# Patient Record
Sex: Female | Born: 1935 | Race: White | Hispanic: No | Smoking: Never smoker
Health system: Southern US, Community
[De-identification: ages and names within clinical notes are randomized; demographics above are authoritative.]

---

## 2015-09-21 ENCOUNTER — Ambulatory Visit (INDEPENDENT_AMBULATORY_CARE_PROVIDER_SITE_OTHER): Payer: PRIVATE HEALTH INSURANCE | Admitting: Family Medicine

## 2015-09-21 VITALS — BP 128/84 | HR 61 | Temp 98.7°F | Resp 16 | Ht 59.84 in | Wt 137.0 lb

## 2015-09-21 DIAGNOSIS — K2100 Gastro-esophageal reflux disease with esophagitis, without bleeding: Secondary | ICD-10-CM

## 2015-09-21 DIAGNOSIS — I83019 Varicose veins of right lower extremity with ulcer of unspecified site: Secondary | ICD-10-CM

## 2015-09-21 DIAGNOSIS — R251 Tremor, unspecified: Secondary | ICD-10-CM

## 2015-09-21 DIAGNOSIS — G5732 Lesion of lateral popliteal nerve, left lower limb: Secondary | ICD-10-CM | POA: Diagnosis not present

## 2015-09-21 DIAGNOSIS — L97919 Non-pressure chronic ulcer of unspecified part of right lower leg with unspecified severity: Secondary | ICD-10-CM

## 2015-09-21 NOTE — Progress Notes (Signed)
This is an 82 year old woman from [place]. She is brought in by her son and daughter-in-law.  Patient has several complaints: She has aching in the lateral calf of the left leg for over 6 months. She says the pain is also in the lateral aspect of her left thigh. She's had no injury.  Patient also has a head tremor and she wants to know if there is any make him be done about this. This is been going on for over 10 years.  Final patient complains about chronic cough and some bilious reflux. She was told and [place] that she had COPD and need to take an inhaler. The inhaler has not helped.  Patient also complains about noticeable large varicose veins in the right leg. She's been trying a knee length compressive stocking but that has not helped.  Objective: Diminutive woman in no acute distress  BP 128/84 mmHg  Pulse 61  Temp(Src) 98.7 F (37.1 C)  Resp 16  Ht 4' 11.84" (1.52 m)  Wt 137 lb (62.143 kg)  BMI 26.90 kg/m2  SpO2 98% Chest: Clear Heart: Regular no murmur Patient has a mild slow tremor of her head with her face moving back and forth. I've seen her in the past (15 years ago) sees and this is slightly worse than it was then.   Extremities show marked varicosities in the right lower extremity extending up from the ankle to the mid thigh. She's wearing an elastic stocking there is only minimal compression and only goes up to the knee.  There is some wasting of muscle mass in the peroneal aspect of her left calf. She has no tenderness and no loss of sensation. She has no edema. She has good range of motion of the knee and ankle.  Examination of the legs shows no edema. She does have marked varicose veins in the right leg.  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 2 patient has some reflux that is causing her respiratory symptoms 3 patient has varicose veins in the right leg 4 patient has a mild action tremor of her head  Plan:  Ranitidine 75 mg at night Compressive stockings to thigh with the right leg varicosities Stop the inhaler Reassurance regarding the atrophy in the leg and tremor  Signed, Elvina SidleKurt Dorena Dorfman

## 2021-09-10 ENCOUNTER — Ambulatory Visit: Payer: Self-pay | Admitting: Nurse Practitioner

## 2021-10-08 ENCOUNTER — Ambulatory Visit: Payer: Self-pay | Admitting: Internal Medicine

## 2021-10-29 ENCOUNTER — Other Ambulatory Visit (INDEPENDENT_AMBULATORY_CARE_PROVIDER_SITE_OTHER): Payer: Medicaid Other

## 2021-10-29 ENCOUNTER — Encounter: Payer: Self-pay | Admitting: Internal Medicine

## 2021-10-29 ENCOUNTER — Ambulatory Visit (INDEPENDENT_AMBULATORY_CARE_PROVIDER_SITE_OTHER): Payer: Medicaid Other | Admitting: Internal Medicine

## 2021-10-29 VITALS — BP 106/78 | HR 64 | Temp 98.5°F | Ht 59.0 in | Wt 126.0 lb

## 2021-10-29 DIAGNOSIS — S0990XA Unspecified injury of head, initial encounter: Secondary | ICD-10-CM | POA: Diagnosis not present

## 2021-10-29 DIAGNOSIS — Z87820 Personal history of traumatic brain injury: Secondary | ICD-10-CM

## 2021-10-29 DIAGNOSIS — R202 Paresthesia of skin: Secondary | ICD-10-CM | POA: Diagnosis not present

## 2021-10-29 DIAGNOSIS — G250 Essential tremor: Secondary | ICD-10-CM

## 2021-10-29 DIAGNOSIS — E559 Vitamin D deficiency, unspecified: Secondary | ICD-10-CM

## 2021-10-29 DIAGNOSIS — R270 Ataxia, unspecified: Secondary | ICD-10-CM

## 2021-10-29 DIAGNOSIS — R251 Tremor, unspecified: Secondary | ICD-10-CM | POA: Diagnosis not present

## 2021-10-29 DIAGNOSIS — G4700 Insomnia, unspecified: Secondary | ICD-10-CM | POA: Diagnosis not present

## 2021-10-29 MED ORDER — DIAZEPAM 2 MG PO TABS: 1.0000 mg | ORAL_TABLET | Freq: Every evening | ORAL | 3 refills | Status: DC | PRN
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
## 2021-10-30 ENCOUNTER — Other Ambulatory Visit: Payer: Self-pay | Admitting: Internal Medicine

## 2021-10-30 ENCOUNTER — Encounter: Payer: Self-pay | Admitting: Internal Medicine

## 2021-10-30 DIAGNOSIS — G4700 Insomnia, unspecified: Secondary | ICD-10-CM | POA: Insufficient documentation

## 2021-10-30 DIAGNOSIS — Z87820 Personal history of traumatic brain injury: Secondary | ICD-10-CM

## 2021-10-30 DIAGNOSIS — E559 Vitamin D deficiency, unspecified: Secondary | ICD-10-CM

## 2021-10-30 DIAGNOSIS — G250 Essential tremor: Secondary | ICD-10-CM

## 2021-10-30 DIAGNOSIS — R251 Tremor, unspecified: Secondary | ICD-10-CM

## 2021-10-30 DIAGNOSIS — S0990XA Unspecified injury of head, initial encounter: Secondary | ICD-10-CM | POA: Insufficient documentation

## 2021-10-30 LAB — CBC WITH DIFFERENTIAL/PLATELET
Basophils Absolute: 0.1 10*3/uL (ref 0.0–0.1)
Basophils Relative: 0.9 % (ref 0.0–3.0)
Eosinophils Absolute: 0.2 10*3/uL (ref 0.0–0.7)
Eosinophils Relative: 3.5 % (ref 0.0–5.0)
HCT: 42.2 % (ref 36.0–46.0)
Hemoglobin: 13.7 g/dL (ref 12.0–15.0)
Lymphocytes Relative: 35.9 % (ref 12.0–46.0)
Lymphs Abs: 2.5 10*3/uL (ref 0.7–4.0)
MCHC: 32.5 g/dL (ref 30.0–36.0)
MCV: 87 fl (ref 78.0–100.0)
Monocytes Absolute: 0.5 10*3/uL (ref 0.1–1.0)
Monocytes Relative: 7.1 % (ref 3.0–12.0)
Neutro Abs: 3.7 10*3/uL (ref 1.4–7.7)
Neutrophils Relative %: 52.6 % (ref 43.0–77.0)
Platelets: 193 10*3/uL (ref 150.0–400.0)
RBC: 4.85 Mil/uL (ref 3.87–5.11)
RDW: 18.2 % — ABNORMAL HIGH (ref 11.5–15.5)
WBC: 7 10*3/uL (ref 4.0–10.5)

## 2021-10-30 LAB — URINALYSIS
Bilirubin Urine: NEGATIVE
Ketones, ur: NEGATIVE
Leukocytes,Ua: NEGATIVE
Nitrite: NEGATIVE
Specific Gravity, Urine: >=1.03 — AB (ref 1.000–1.030)
Total Protein, Urine: NEGATIVE
Urine Glucose: NEGATIVE
Urobilinogen, UA: 0.2 (ref 0.0–1.0)
pH: 5.5 (ref 5.0–8.0)

## 2021-10-30 LAB — COMPREHENSIVE METABOLIC PANEL
ALT: 13 U/L (ref 0–35)
AST: 22 U/L (ref 0–37)
Albumin: 4.2 g/dL (ref 3.5–5.2)
Alkaline Phosphatase: 74 U/L (ref 39–117)
BUN: 24 mg/dL — ABNORMAL HIGH (ref 6–23)
CO2: 27 mEq/L (ref 19–32)
Calcium: 9.2 mg/dL (ref 8.4–10.5)
Chloride: 107 mEq/L (ref 96–112)
Creatinine, Ser: 0.92 mg/dL (ref 0.40–1.20)
GFR: 56.15 mL/min — ABNORMAL LOW (ref >60.00)
Glucose, Bld: 80 mg/dL (ref 70–99)
Potassium: 4.3 mEq/L (ref 3.5–5.1)
Sodium: 141 mEq/L (ref 135–145)
Total Bilirubin: 0.3 mg/dL (ref 0.2–1.2)
Total Protein: 7.1 g/dL (ref 6.0–8.3)

## 2021-10-30 LAB — LIPID PANEL
Cholesterol: 184 mg/dL (ref 0–200)
HDL: 56.3 mg/dL (ref >39.00)
NonHDL: 128.1
Total CHOL/HDL Ratio: 3
Triglycerides: 204 mg/dL — ABNORMAL HIGH (ref 0.0–149.0)
VLDL: 40.8 mg/dL — ABNORMAL HIGH (ref 0.0–40.0)

## 2021-10-30 LAB — TSH: TSH: 2.24 u[IU]/mL (ref 0.35–5.50)

## 2021-10-30 LAB — VITAMIN D 25 HYDROXY (VIT D DEFICIENCY, FRACTURES): VITD: 20.81 ng/mL — ABNORMAL LOW (ref 30.00–100.00)

## 2021-10-30 LAB — VITAMIN B12: Vitamin B-12: >1504 pg/mL — ABNORMAL HIGH (ref 211–911)

## 2021-10-30 LAB — LDL CHOLESTEROL, DIRECT: Direct LDL: 115 mg/dL

## 2021-10-30 MED ORDER — B COMPLEX PLUS PO TABS: 1.0000 | ORAL_TABLET | Freq: Every day | ORAL | 3 refills | Status: AC

## 2021-10-30 MED ORDER — VITAMIN D (ERGOCALCIFEROL) 1.25 MG (50000 UNIT) PO CAPS: 50000.0000 [IU] | ORAL_CAPSULE | ORAL | 0 refills | Status: DC

## 2021-10-30 MED ORDER — VITAMIN D3 50 MCG (2000 UT) PO CAPS: 2000.0000 [IU] | ORAL_CAPSULE | Freq: Every day | ORAL | 3 refills | Status: AC

## 2021-10-30 NOTE — Assessment & Plan Note (Signed)
Tremor in the head and the right hand primarily.  Shaky voice.  Tremor was triggered by her remote TBI. We will try diazepam at night at low-dose. ? Potential benefits of a long term benzodiazepines  use as well as potential risks  and complications were explained to the patient and were aknowledged. ?Consider neurology referral ?

## 2021-10-30 NOTE — Assessment & Plan Note (Signed)
Likely multifactorial.  Gait/balance training/fall prevention discussed. ?

## 2021-10-30 NOTE — Assessment & Plan Note (Signed)
Remote TBI, likely triggered her tremors ?We will try diazepam at night at low-dose. ? Potential benefits of a long term benzodiazepines  use as well as potential risks  and complications were explained to the patient and were aknowledged. ?Vitamin B complex ?

## 2021-10-30 NOTE — Assessment & Plan Note (Signed)
New.  Low Vitamin D levels - please, start Vit D prescription 50000 iu weekly (Rx emailed to pharmacy) followed by over-the-counter Vit D 2000 iu daily. ? ?
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
## 2021-10-30 NOTE — Assessment & Plan Note (Signed)
Low Vitamin D levels - please, start Vit D prescription 50000 iu weekly (Rx emailed to pharmacy) followed by over-the-counter Vit D 2000 iu daily. ?

## 2021-10-30 NOTE — Assessment & Plan Note (Signed)
Gait/balance training/fall prevention discussed. ?

## 2021-10-30 NOTE — Assessment & Plan Note (Signed)
Tremor in the head and the right hand primarily.  Shaky voice.  Tremor was triggered by her remote TBI. We will try diazepam at night at low-dose. ? Potential benefits of a long term benzodiazepines  use as well as potential risks  and complications were explained to the patient and were aknowledged. ?Consider neurology referral ?

## 2021-10-30 NOTE — Assessment & Plan Note (Signed)
We will use a low-dose Valium.  Hopefully it will help her tremors as well ? Potential benefits of a long term benzodiazepines  use as well as potential risks  and complications were explained to the patient and were aknowledged. ? ?

## 2021-10-30 NOTE — Assessment & Plan Note (Addendum)
Remote TBI, likely triggered her tremors ?We will try diazepam at night at low-dose. ? Potential benefits of a long term benzodiazepines  use as well as potential risks  and complications were explained to the patient and were aknowledged. ?Started on vitamin B complex ? ?

## 2021-11-06 ENCOUNTER — Encounter: Payer: Self-pay | Admitting: Neurology
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
## 2021-11-17 ENCOUNTER — Ambulatory Visit: Payer: Medicaid Other | Admitting: Neurology

## 2021-11-17 ENCOUNTER — Encounter: Payer: Self-pay | Admitting: Neurology

## 2021-11-17 VITALS — BP 124/82 | HR 68 | Ht 60.0 in | Wt 127.0 lb

## 2021-11-17 DIAGNOSIS — M542 Cervicalgia: Secondary | ICD-10-CM

## 2021-11-17 DIAGNOSIS — R531 Weakness: Secondary | ICD-10-CM

## 2021-11-17 DIAGNOSIS — R441 Visual hallucinations: Secondary | ICD-10-CM | POA: Diagnosis not present

## 2021-11-17 DIAGNOSIS — G243 Spasmodic torticollis: Secondary | ICD-10-CM | POA: Diagnosis not present

## 2021-11-17 DIAGNOSIS — R251 Tremor, unspecified: Secondary | ICD-10-CM | POA: Diagnosis not present

## 2021-11-17 NOTE — Patient Instructions (Addendum)
Start primidone 50 mg - 1/2 tablet at bedtime for 1 week and then increase to 1 tablet at bedtime for 2 weeks and then 1 tablet twice per day thereafter ? ?A referral to [facility] has been placed for your MRI someone will contact you directly to schedule your appt. They are located at [address] [facility]. Please contact them directly by calling [phone] with any questions regarding your referral. ? ?You can think about the botox. ? ? ?

## 2021-11-21 ENCOUNTER — Other Ambulatory Visit (INDEPENDENT_AMBULATORY_CARE_PROVIDER_SITE_OTHER): Payer: Medicaid Other

## 2021-11-21 ENCOUNTER — Other Ambulatory Visit: Payer: Self-pay | Admitting: Internal Medicine

## 2021-11-21 DIAGNOSIS — N1831 Chronic kidney disease, stage 3a: Secondary | ICD-10-CM

## 2021-11-21 DIAGNOSIS — E039 Hypothyroidism, unspecified: Secondary | ICD-10-CM | POA: Diagnosis not present

## 2021-11-21 DIAGNOSIS — E559 Vitamin D deficiency, unspecified: Secondary | ICD-10-CM

## 2021-11-21 LAB — COMPREHENSIVE METABOLIC PANEL
ALT: 15 U/L (ref 0–35)
AST: 22 U/L (ref 0–37)
Albumin: 3.9 g/dL (ref 3.5–5.2)
Alkaline Phosphatase: 70 U/L (ref 39–117)
BUN: 25 mg/dL — ABNORMAL HIGH (ref 6–23)
CO2: 26 mEq/L (ref 19–32)
Calcium: 9.3 mg/dL (ref 8.4–10.5)
Chloride: 108 mEq/L (ref 96–112)
Creatinine, Ser: 0.78 mg/dL (ref 0.40–1.20)
GFR: 68.42 mL/min (ref >60.00)
Glucose, Bld: 99 mg/dL (ref 70–99)
Potassium: 4 mEq/L (ref 3.5–5.1)
Sodium: 143 mEq/L (ref 135–145)
Total Bilirubin: 0.6 mg/dL (ref 0.2–1.2)
Total Protein: 6.8 g/dL (ref 6.0–8.3)

## 2021-11-21 LAB — T3, FREE: T3, Free: 2.5 pg/mL (ref 2.3–4.2)

## 2021-11-21 LAB — T4, FREE: Free T4: 0.82 ng/dL (ref 0.60–1.60)

## 2021-11-21 LAB — TSH: TSH: 3.3 u[IU]/mL (ref 0.35–5.50)

## 2021-11-21 NOTE — Progress Notes (Unsigned)
free 

## 2021-12-05 ENCOUNTER — Ambulatory Visit
Admission: RE | Admit: 2021-12-05 | Discharge: 2021-12-05 | Disposition: A | Discharge status: Home / Self Care | Payer: Medicaid Other | Source: Ambulatory Visit | Attending: Neurology | Admitting: Neurology

## 2021-12-05 DIAGNOSIS — G319 Degenerative disease of nervous system, unspecified: Secondary | ICD-10-CM | POA: Diagnosis not present

## 2021-12-05 DIAGNOSIS — M47812 Spondylosis without myelopathy or radiculopathy, cervical region: Secondary | ICD-10-CM | POA: Diagnosis not present

## 2021-12-05 DIAGNOSIS — I6381 Other cerebral infarction due to occlusion or stenosis of small artery: Secondary | ICD-10-CM | POA: Diagnosis not present

## 2021-12-05 DIAGNOSIS — R441 Visual hallucinations: Secondary | ICD-10-CM

## 2021-12-05 DIAGNOSIS — G243 Spasmodic torticollis: Secondary | ICD-10-CM

## 2021-12-05 DIAGNOSIS — R531 Weakness: Secondary | ICD-10-CM

## 2021-12-05 DIAGNOSIS — M542 Cervicalgia: Secondary | ICD-10-CM | POA: Diagnosis not present

## 2021-12-05 DIAGNOSIS — I614 Nontraumatic intracerebral hemorrhage in cerebellum: Secondary | ICD-10-CM | POA: Diagnosis not present
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
## 2021-12-05 IMAGING — MR MR CERVICAL SPINE W/O CM
5 of 6 series · 35 of 48 positions shown · non-contrast
Comparison: Same-day brain MRI [DATE].

CLINICAL DATA: Cervical dystonia.  Neck pain, chronic.

EXAM:
MRI CERVICAL SPINE WITHOUT CONTRAST
TECHNIQUE: Multiplanar, multisequence MR imaging of the cervical spine was
performed. No intravenous contrast was administered.

[Series 2: T2 · sagittal · 3.0mm · 0.45mm/px · 7 of 16 slices shown (1 of 2)]
[im 1/16]
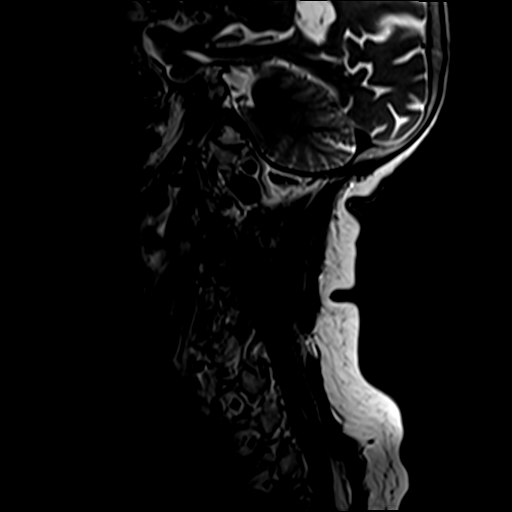
[im 3/16]
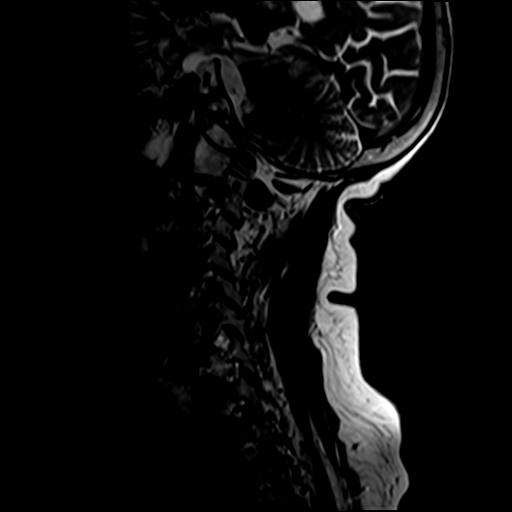
[im 6/16]
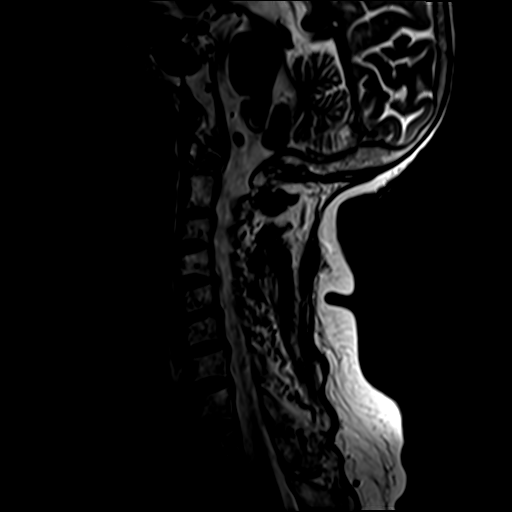
[im 8/16]
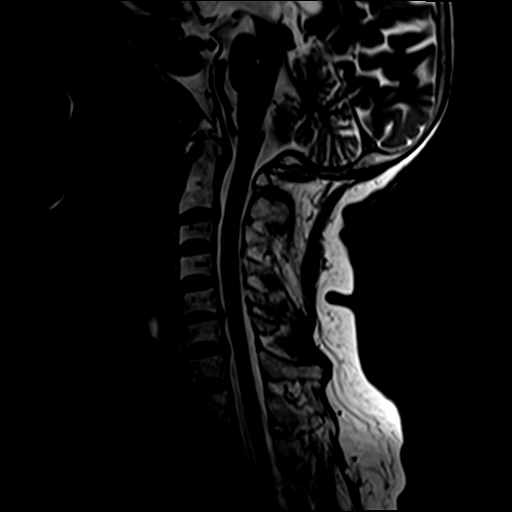
[im 11/16]
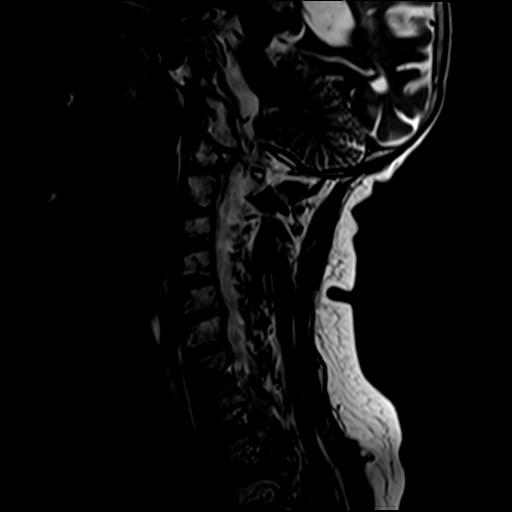
[im 13/16]
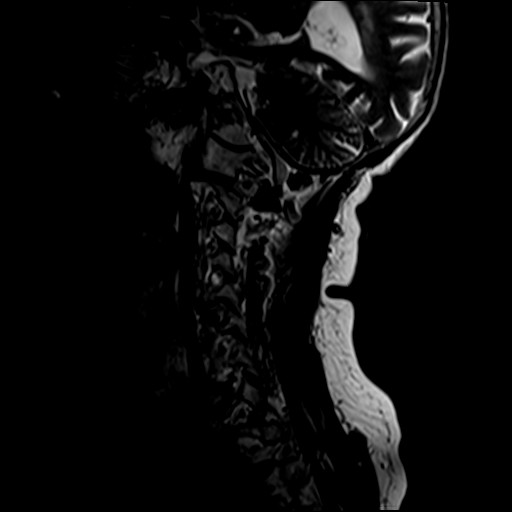
[im 16/16]
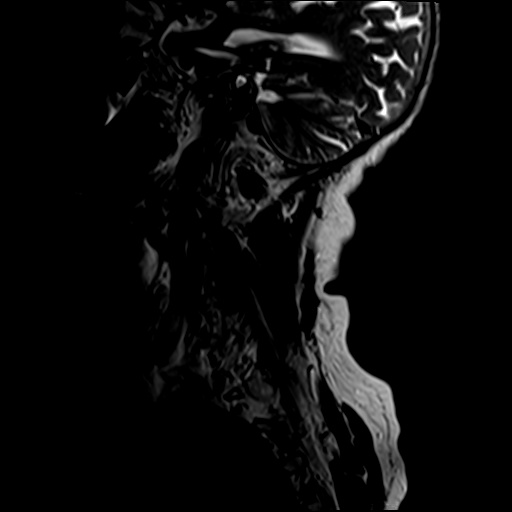

[Series 3: STIR · sagittal · 3.0mm · 0.90mm/px · 6 of 15 slices shown]
[im 1/15]
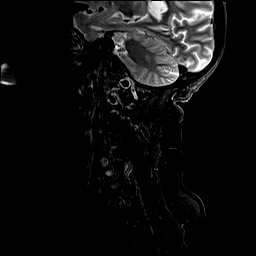
[im 3/15]
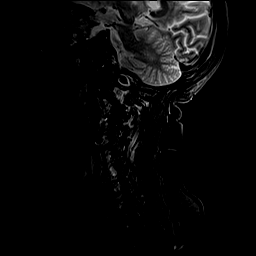
[im 6/15]
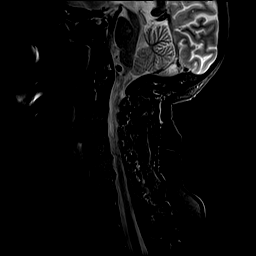
[im 9/15]
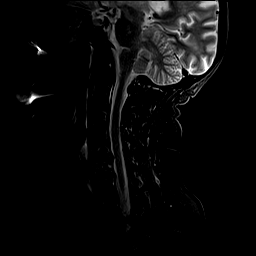
[im 12/15]
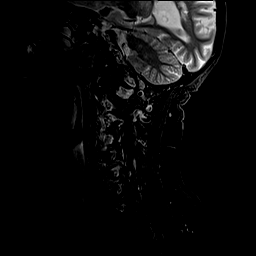
[im 15/15]
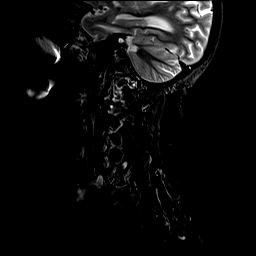

[Series 4: T1 · sagittal · 3.0mm · 0.90mm/px · 7 of 16 slices shown]
[im 1/16]
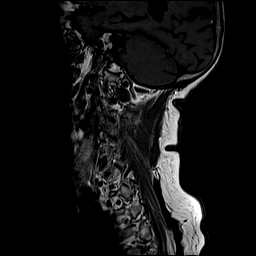
[im 3/16]
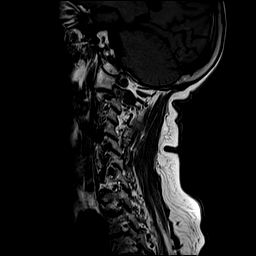
[im 6/16]
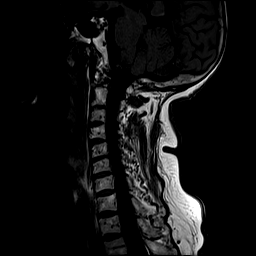
[im 8/16]
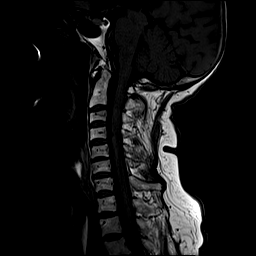
[im 11/16]
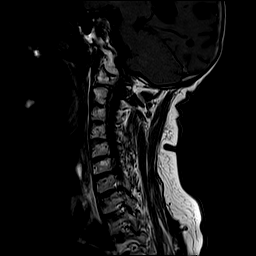
[im 13/16]
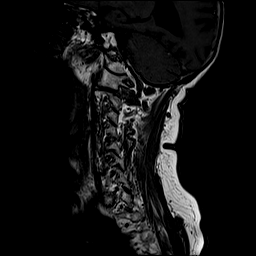
[im 16/16]
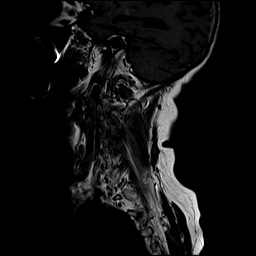

[Series 5: T2 · axial · 3.0mm · 0.70mm/px · z∈[-206,-110]mm · 11 of 26 slices shown (2 of 2)]
[im 1/26]
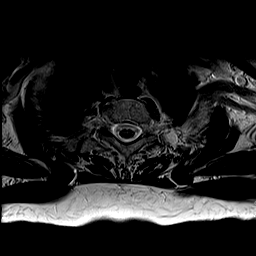
[im 3/26]
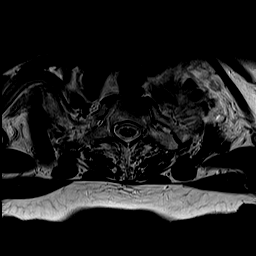
[im 6/26]
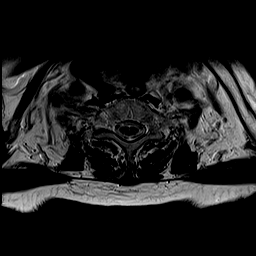
[im 8/26]
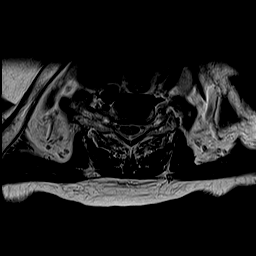
[im 11/26]
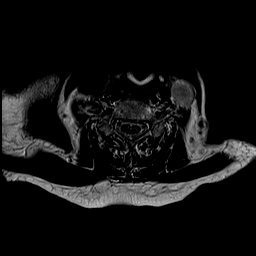
[im 13/26]
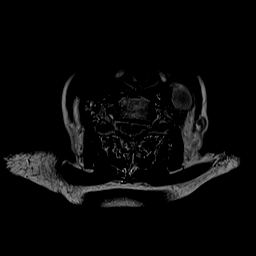
[im 16/26]
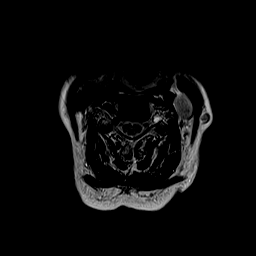
[im 18/26]
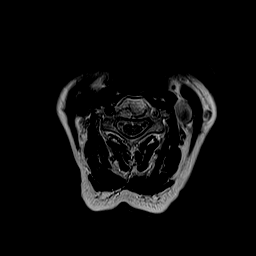
[im 21/26]
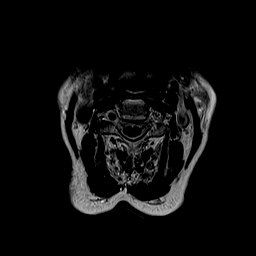
[im 23/26]
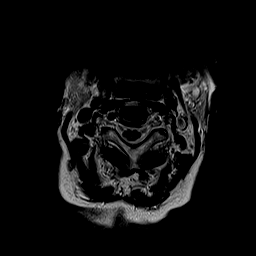
[im 26/26]
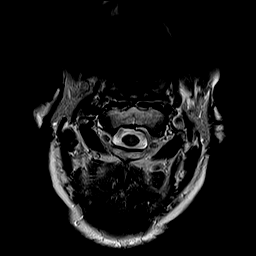

[Series 6: GRE · axial · 3.0mm · 0.35mm/px · z∈[-206,-155]mm · 4 of 14 slices shown]
[im 1/14]
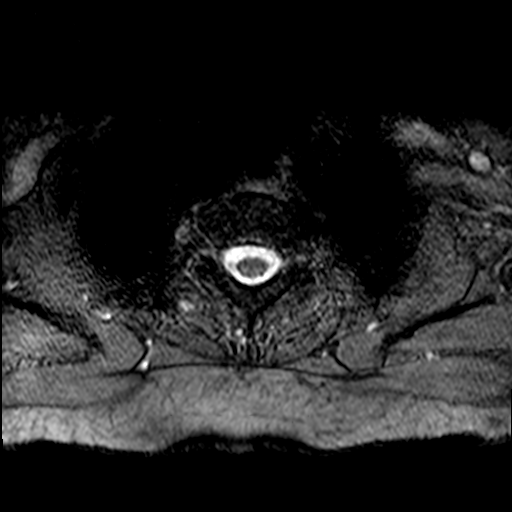
[im 3/14]
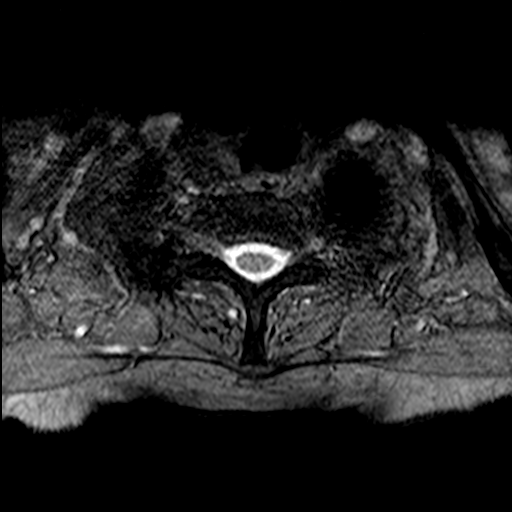
[im 6/14]
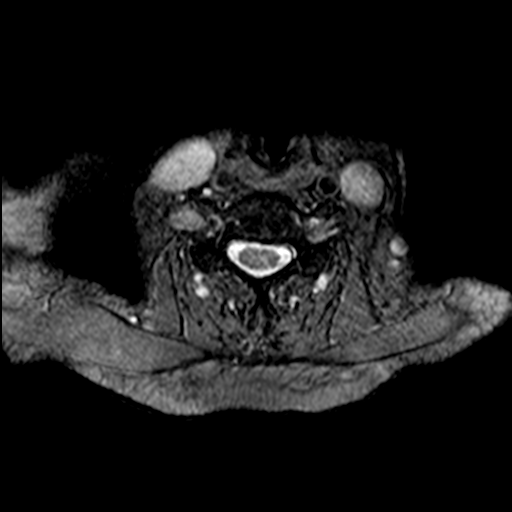
[im 8/14]
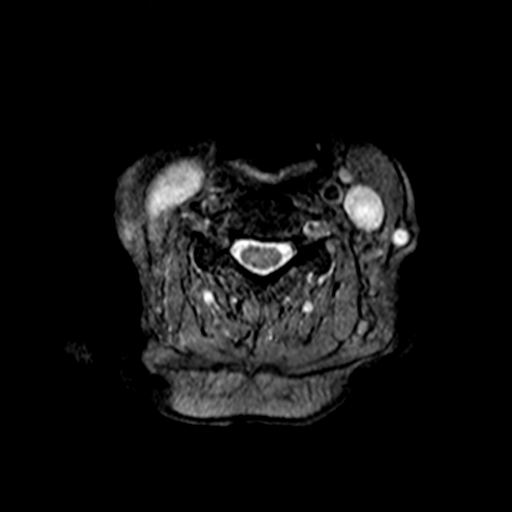

[35 of 48 positions shown; findings below may reference images not displayed]

FINDINGS: Alignment: Straightening of the expected cervical lordosis. No
significant spondylolisthesis.

Vertebrae: Vertebral body height is maintained. No significant
marrow edema or focal suspicious osseous lesion.

Cord: No signal abnormality identified within the cervical spinal
cord.

Posterior Fossa, vertebral arteries, paraspinal tissues: Posterior
fossa better assessed on same-day brain MRI. Signal abnormality
within portions of the imaged cervical left vertebral artery,
suggesting high-grade stenosis or vessel occlusion. No paraspinal
mass or collection.

Disc levels:

Moderate disc degeneration at C5-C6. No more than mild disc
degeneration at the remaining levels.

C2-C3: Shallow disc bulge. No significant spinal canal or foraminal
stenosis.

C3-C4: Slight disc bulge. No significant spinal canal or foraminal
stenosis.

C4-C5: No significant disc herniation or spinal canal stenosis. The
right vertebral artery is tortuous at this level, looping within the
right neural foramen. No significant degenerative foraminal
stenosis.

C5-C6: Disc bulge. Uncovertebral hypertrophy (greater on the left).
Mild relative spinal canal narrowing (without spinal cord mass
effect). Moderate left neural foraminal narrowing.

C6-C7: Disc bulge. Uncovertebral hypertrophy (greater on the right).
Mild partial effacement of the ventral thecal sac (without spinal
cord mass effect). No significant foraminal stenosis.

C7-T1: No significant disc herniation or stenosis.

Imppression #3 will be called to the ordering clinician or
representative by the Radiologist Assistant, and communication
documented in the PACS or [REDACTED].
IMPRESSION: Cervical spondylosis, as outlined. No more than mild spinal canal
narrowing. At C5-C6, uncovertebral hypertrophy contributes to
moderate left neural foraminal narrowing. Disc degeneration is
greatest at C5-C6 (moderate at this level).

Nonspecific straightening of the expected cervical lordosis.

Signal abnormality within portions of the imaged cervical left
vertebral artery, suggesting high-grade stenosis or vessel
occlusion. MR or CT angiography of the neck may be obtained for
further evaluation, as clinically warranted.

## 2021-12-08 ENCOUNTER — Telehealth: Payer: Self-pay | Admitting: Neurology

## 2021-12-08 DIAGNOSIS — I6509 Occlusion and stenosis of unspecified vertebral artery: Secondary | ICD-10-CM

## 2021-12-08 NOTE — Telephone Encounter (Signed)
Call pt/son (son speaks english, patient does not).  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  She should continue on the aspirin and have strict BP control.  MRI cervical spine shows degenerative/arthritic changes but nothing that would cause the dystonia.  However, on the MRI cervical spine they noted that the artery that goes up the back of the head and supplies brain looks pretty narrowed and radiology would like a better look at that.  If agreeable order MRA NECK with dx of vertebral artery stenosis.

## 2021-12-09 MED ORDER — PRIMIDONE 50 MG PO TABS: ORAL_TABLET | ORAL | 1 refills | Status: DC

## 2021-12-09 NOTE — Telephone Encounter (Signed)
I apologize re: primidone.  [facility] was [facility].  Find out how we can help son/what questions he has.

## 2021-12-09 NOTE — Telephone Encounter (Signed)
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  She should continue on the aspirin and have strict BP control.  MRI cervical spine shows degenerative/arthritic changes but nothing that would cause the dystonia.  However, on the MRI cervical spine they noted that the artery that goes up the back of the head and supplies brain looks pretty narrowed and radiology would like a better look at that.  Pt son is asking how the MRA of the neck would be. He was advised that we would have to order it and do the PA and then [facility] [facility] would let them know about the copay. Order placed in Epic for the MRA of the neck.  He also stated that they never got the new prescription that was to be called in at her appointment,  PT son also requested that Dr.Tat call him back he would like to talk to her

## 2021-12-09 NOTE — Telephone Encounter (Signed)
Pt son called an informed that primidone is at [facility], and was asking for MRI results to be on my chart. He was advised that if he activates her my chart he will be able to see it, he stated that he will activate the my chart,

## 2022-01-02 ENCOUNTER — Ambulatory Visit
Admission: RE | Admit: 2022-01-02 | Discharge: 2022-01-02 | Disposition: A | Discharge status: Home / Self Care | Payer: Medicaid Other | Source: Ambulatory Visit | Attending: Neurology | Admitting: Neurology

## 2022-01-02 DIAGNOSIS — I6509 Occlusion and stenosis of unspecified vertebral artery: Secondary | ICD-10-CM

## 2022-03-10 ENCOUNTER — Other Ambulatory Visit: Payer: Self-pay | Admitting: Neurology

## 2022-03-10 ENCOUNTER — Telehealth: Payer: Self-pay

## 2022-03-10 NOTE — Telephone Encounter (Signed)
Called Pt son back and give result per Dr. Arbutus Leas. He wanted cope of results , he was told he needed to call GI for that. He wanted it for Doctor appointment tomorrow. His Doctor is [name] and he is in [employer] and made him aware that he would be able to see results in computer.

## 2022-03-11 ENCOUNTER — Encounter: Payer: Self-pay | Admitting: Internal Medicine

## 2022-03-11 ENCOUNTER — Ambulatory Visit (INDEPENDENT_AMBULATORY_CARE_PROVIDER_SITE_OTHER): Payer: Medicaid Other | Admitting: Internal Medicine

## 2022-03-11 VITALS — BP 132/80 | HR 61 | Temp 98.1°F | Ht 60.0 in | Wt 122.2 lb

## 2022-03-11 DIAGNOSIS — M79662 Pain in left lower leg: Secondary | ICD-10-CM

## 2022-03-11 DIAGNOSIS — G250 Essential tremor: Secondary | ICD-10-CM

## 2022-03-11 DIAGNOSIS — R270 Ataxia, unspecified: Secondary | ICD-10-CM

## 2022-03-11 DIAGNOSIS — M79669 Pain in unspecified lower leg: Secondary | ICD-10-CM | POA: Insufficient documentation

## 2022-03-11 DIAGNOSIS — G243 Spasmodic torticollis: Secondary | ICD-10-CM | POA: Diagnosis not present

## 2022-03-11 DIAGNOSIS — S0990XA Unspecified injury of head, initial encounter: Secondary | ICD-10-CM | POA: Diagnosis not present

## 2022-03-11 MED ORDER — DIAZEPAM 2 MG PO TABS: 1.0000 mg | ORAL_TABLET | Freq: Three times a day (TID) | ORAL | 3 refills | Status: DC | PRN

## 2022-03-11 NOTE — Progress Notes (Signed)
Subjective:  Patient ID: Crystal Rodgers, female    DOB: [birth year]  Age: 88 y.o. MRN: [id number]  CC: Dizziness (Son states want to f/u on MRI also need Ref for homes health)   HPI [name] presents for tremor, cervical dystonia, anxiety. Here w/her son [name]....  Outpatient Medications Prior to Visit  Medication Sig Dispense Refill   aspirin EC 81 MG tablet Take 81 mg by mouth daily. Swallow whole.     B Complex-Folic Acid (B COMPLEX PLUS) TABS Take 1 tablet by mouth daily. 100 tablet 3   Cholecalciferol (VITAMIN D3) 50 MCG (2000 UT) capsule Take 1 capsule (2,000 Units total) by mouth daily. 100 capsule 3   primidone (MYSOLINE) 50 MG tablet 1/2 po q hs x 1 week, then 1 po q hs x 2 week, then 1 po bid 180 tablet 1   Vitamin D, Ergocalciferol, (DRISDOL) 1.25 MG (50000 UNIT) CAPS capsule Take 1 capsule (50,000 Units total) by mouth every 7 (seven) days. 8 capsule 0   diazepam (VALIUM) 2 MG tablet Take 0.5-1 tablets (1-2 mg total) by mouth at bedtime as needed. 30 tablet 3   No facility-administered medications prior to visit.    ROS: Review of Systems  Constitutional:  Negative for activity change, appetite change, chills, fatigue and unexpected weight change.  HENT:  Negative for congestion, mouth sores and sinus pressure.   Eyes:  Negative for visual disturbance.  Respiratory:  Negative for cough and chest tightness.   Gastrointestinal:  Negative for abdominal pain and nausea.  Genitourinary:  Negative for difficulty urinating, frequency and vaginal pain.  Musculoskeletal:  Positive for gait problem and neck stiffness. Negative for arthralgias and back pain.  Skin:  Negative for pallor and rash.  Neurological:  Positive for tremors. Negative for dizziness, weakness, numbness and headaches.  Psychiatric/Behavioral:  Positive for sleep disturbance. Negative for confusion and suicidal ideas. The patient is nervous/anxious.     Objective:  BP 132/80 (BP Location: Left  Arm)   Pulse 61   Temp 98.1 F (36.7 C) (Oral)   Ht 5' (1.524 m)   Wt 122 lb 3.2 oz (55.4 kg)   LMP  (LMP Unknown)   SpO2 96%   BMI 23.87 kg/m   BP Readings from Last 3 Encounters:  03/11/22 132/80  11/17/21 124/82  10/29/21 106/78    Wt Readings from Last 3 Encounters:  03/11/22 122 lb 3.2 oz (55.4 kg)  11/17/21 127 lb (57.6 kg)  10/29/21 126 lb (57.2 kg)    Physical Exam Constitutional:      General: She is not in acute distress.    Appearance: She is well-developed.  HENT:     Head: Normocephalic.     Right Ear: External ear normal.     Left Ear: External ear normal.     Nose: Nose normal.  Eyes:     General:        Right eye: No discharge.        Left eye: No discharge.     Conjunctiva/sclera: Conjunctivae normal.     Pupils: Pupils are equal, round, and reactive to light.  Neck:     Thyroid: No thyromegaly.     Vascular: No JVD.     Trachea: No tracheal deviation.  Cardiovascular:     Rate and Rhythm: Normal rate and regular rhythm.     Heart sounds: Normal heart sounds.  Pulmonary:     Effort: No respiratory distress.     Breath sounds:  No stridor. No wheezing.  Abdominal:     General: Bowel sounds are normal. There is no distension.     Palpations: Abdomen is soft. There is no mass.     Tenderness: There is no abdominal tenderness. There is no guarding or rebound.  Musculoskeletal:        General: No tenderness.     Cervical back: Normal range of motion and neck supple. No rigidity.  Lymphadenopathy:     Cervical: No cervical adenopathy.  Skin:    Findings: No erythema or rash.  Neurological:     Cranial Nerves: No cranial nerve deficit.     Motor: Weakness present. No abnormal muscle tone.     Coordination: Coordination abnormal.     Gait: Gait abnormal.     Deep Tendon Reflexes: Reflexes normal.  Psychiatric:        Behavior: Behavior normal.        Thought Content: Thought content normal.        Judgment: Judgment normal.   Head and R  hand tremor, spastic neck Ataxic    A total time of 50 minutes was spent preparing to see the patient, reviewing tests,  and other medical records.  Also, obtaining history and performing comprehensive physical exam.  Additionally, counseling the patient regarding the above listed issues.   Finally, documenting clinical information in the health records, coordination of care, educating the patient, filling out PCS form.    Lab Results  Component Value Date   WBC 7.0 10/29/2021   HGB 13.7 10/29/2021   HCT 42.2 10/29/2021   PLT 193.0 10/29/2021   GLUCOSE 99 11/21/2021   CHOL 184 10/29/2021   TRIG 204.0 (H) 10/29/2021   HDL 56.30 10/29/2021   LDLDIRECT 115.0 10/29/2021   ALT 15 11/21/2021   AST 22 11/21/2021   NA 143 11/21/2021   K 4.0 11/21/2021   CL 108 11/21/2021   CREATININE 0.78 11/21/2021   BUN 25 (H) 11/21/2021   CO2 26 11/21/2021   TSH 3.30 11/21/2021    MR ANGIO NECK WO CONTRAST  Result Date: 03/10/2022 CLINICAL DATA:  This study identified as missing a report at 8:19 am on 03/10/2022. 88 year old female with a history of tremor, weakness, neck pain. EXAM: MRA NECK WITHOUT CONTRAST TECHNIQUE: Angiographic images of the neck were acquired using MRA technique without intravenous contrast. Carotid stenosis measurements (when applicable) are obtained utilizing NASCET criteria, using the distal internal carotid diameter as the denominator. COMPARISON:  Head and cervical spine MRI 12/05/2021. FINDINGS: Aortic arch: 3 vessel arch configuration. Mild arch tortuosity. No age advanced arch atherosclerosis is evident. Right carotid system: Mildly tortuous brachiocephalic artery and proximal right CCA without significant stenosis. Patent right carotid bifurcation without significant stenosis. Tortuous right ICA distal to the bulb, with normal appearing antegrade flow through the right ICA siphon and terminus. Left carotid system: Similar tortuosity with no evidence of hemodynamically  significant stenosis. Antegrade flow through the left ICA terminus. Vertebral arteries: Proximal left subclavian artery appears within normal limits. Diminutive/absent flow void of the left vertebral artery on the may cervical MRI. And there is little to no antegrade flow signal in the left vertebral artery from the thoracic inlet to the skull base. Evidence of reconstituted flow in the left V4 segment from the vertebrobasilar junction. Contralateral right vertebral artery appears dominant, tortuous and patent to the vertebrobasilar junction with preserved antegrade flow signal and no evidence of hemodynamically significant stenosis. Proximal right subclavian artery appears within normal limits. Other:  Grossly negative for age visible anterior and posterior circulation. No evidence of intracranial mass effect or ventriculomegaly. No obvious neck mass or lymphadenopathy. IMPRESSION: 1. 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 Evidence of retrograde reconstitution of the left V4 segment from the vertebrobasilar junction. 2. Dominant Right Vertebral Artery with no evidence of stenosis. 3. Tortuous ICAs with no evidence of carotid stenosis. Electronically Signed   By: Odessa Fleming M.D.   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 PT/OT at home [facility] forms        Relevant Orders   Ambulatory referral to Home Health   Essential tremor    F/u w/Dr Tat Try Diazepam low dose tid Cont w/Primidone      Relevant Orders   Ambulatory  referral to Home Health   Lower leg pain    LLE pain x years Pt declined X rays Blue-Emu cream was recommended to use 2-3 times a day Heating sock w/rice         Meds ordered this encounter  Medications   diazepam (VALIUM) 2 MG tablet    Sig: Take 0.5-1 tablets (1-2 mg total) by mouth every 8 (eight) hours as needed (Use or shaking, anxiety, insomnia).    Dispense:  90 tablet    Refill:  3      Follow-up: Return in about 3 months (around 06/11/2022) for a follow-up visit.  Sonda Primes, MD

## 2022-03-11 NOTE — Assessment & Plan Note (Signed)
F/u w/Dr Tat Try Diazepam low dose tid Cont w/Primidone

## 2022-03-11 NOTE — Patient Instructions (Signed)
Blue-Emu cream -- use 2-3 times a day ? ?

## 2022-03-11 NOTE — Assessment & Plan Note (Addendum)
LLE pain x years Pt declined X rays Blue-Emu cream was recommended to use 2-3 times a day Heating sock w/rice

## 2022-03-11 NOTE — Assessment & Plan Note (Signed)
F/u w/Dr Tat Not interested in Botox inj Will use Diazepam up to tid  Potential benefits of a long term benzodiazepines  use as well as potential risks  and complications were explained to the patient and were aknowledged. PT/OT at home [facility] forms

## 2022-03-23 ENCOUNTER — Telehealth: Payer: Self-pay | Admitting: *Deleted

## 2022-03-23 NOTE — Telephone Encounter (Signed)
Called son no answer LMOM mom (PCS) forms are ready for pick-up. Will be left at front desk.Marland KitchenRaechel Chute
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
## 2022-05-15 ENCOUNTER — Encounter: Payer: Self-pay | Admitting: Neurology

## 2022-05-15 ENCOUNTER — Ambulatory Visit: Payer: Medicaid Other | Admitting: Neurology

## 2022-05-15 VITALS — BP 126/82 | HR 83 | Ht 60.0 in | Wt 126.0 lb

## 2022-05-15 DIAGNOSIS — G250 Essential tremor: Secondary | ICD-10-CM | POA: Diagnosis not present

## 2022-05-15 DIAGNOSIS — R2681 Unsteadiness on feet: Secondary | ICD-10-CM | POA: Diagnosis not present

## 2022-05-15 MED ORDER — AMBULATORY NON FORMULARY MEDICATION: 0 refills | Status: AC

## 2022-05-15 MED ORDER — PRIMIDONE 50 MG PO TABS: ORAL_TABLET | ORAL | 1 refills | Status: DC

## 2022-05-15 NOTE — Patient Instructions (Signed)
Increase primidone 50 mg, 2 in the AM, 1 at bed  The physicians and staff at [facility] are committed to providing excellent care. You may receive a survey requesting feedback about your experience at our office. We strive to receive "very good" responses to the survey questions. If you feel that your experience would prevent you from giving the office a "very good " response, please contact our office to try to remedy the situation. We may be reached at [phone]. Thank you for taking the time out of your busy day to complete the survey.

## 2022-05-29 ENCOUNTER — Ambulatory Visit: Payer: Medicaid Other | Attending: Neurology | Admitting: Physical Therapy

## 2022-05-29 VITALS — BP 139/76

## 2022-05-29 DIAGNOSIS — Z9181 History of falling: Secondary | ICD-10-CM | POA: Diagnosis present

## 2022-05-29 DIAGNOSIS — R2689 Other abnormalities of gait and mobility: Secondary | ICD-10-CM | POA: Diagnosis present

## 2022-05-29 DIAGNOSIS — R2681 Unsteadiness on feet: Secondary | ICD-10-CM | POA: Insufficient documentation
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
## 2022-06-02 ENCOUNTER — Ambulatory Visit: Payer: Medicaid Other

## 2022-06-02 ENCOUNTER — Ambulatory Visit: Payer: Medicaid Other | Admitting: Physical Therapy

## 2022-06-02 ENCOUNTER — Encounter: Payer: Self-pay | Admitting: Physical Therapy

## 2022-06-02 DIAGNOSIS — Z9181 History of falling: Secondary | ICD-10-CM

## 2022-06-02 DIAGNOSIS — R2681 Unsteadiness on feet: Secondary | ICD-10-CM

## 2022-06-02 DIAGNOSIS — R2689 Other abnormalities of gait and mobility: Secondary | ICD-10-CM
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
## 2022-06-03 ENCOUNTER — Telehealth: Payer: Self-pay | Admitting: Internal Medicine

## 2022-06-03 NOTE — Telephone Encounter (Signed)
Patient is having trouble with her home health - son would like to discuss this with Dr. Posey Rea

## 2022-06-08 NOTE — Telephone Encounter (Signed)
I called. Left a VM 

## 2022-06-09 NOTE — Telephone Encounter (Signed)
Patient's son stopped by to drop off paperwork - he states that we submitted it to the wrong place - it needs to be submitted to managed Medicaid. His call back number is [phone] and his email is [email or web address].  Paperwork is being given to [place].

## 2022-06-09 NOTE — Telephone Encounter (Signed)
Place forms on MD desk to complete.Marland KitchenRaechel Chute

## 2022-06-10 NOTE — Telephone Encounter (Signed)
Called son to verify what he need w/ form bcz its filled out. Son states need form sent to manage care with his insurance for personal care services. Inform pt son we would contact insurance to see exactly what he need. Called insurance spoke w/ rep she states [employer] forms can be fax to [phone]. Telephone ID was [id number].  Called son inform him the form has been re faxed to insurance/...lmb

## 2022-06-12 ENCOUNTER — Encounter: Payer: Self-pay | Admitting: Physical Therapy

## 2022-06-12 ENCOUNTER — Ambulatory Visit: Payer: Medicaid Other | Attending: Neurology | Admitting: Physical Therapy

## 2022-06-12 DIAGNOSIS — R2689 Other abnormalities of gait and mobility: Secondary | ICD-10-CM | POA: Insufficient documentation

## 2022-06-12 DIAGNOSIS — Z9181 History of falling: Secondary | ICD-10-CM | POA: Insufficient documentation

## 2022-06-12 DIAGNOSIS — R2681 Unsteadiness on feet: Secondary | ICD-10-CM | POA: Insufficient documentation
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
## 2022-06-16 ENCOUNTER — Ambulatory Visit (INDEPENDENT_AMBULATORY_CARE_PROVIDER_SITE_OTHER): Payer: Medicaid Other | Admitting: Internal Medicine

## 2022-06-16 ENCOUNTER — Ambulatory Visit (INDEPENDENT_AMBULATORY_CARE_PROVIDER_SITE_OTHER): Payer: Medicaid Other

## 2022-06-16 ENCOUNTER — Encounter: Payer: Self-pay | Admitting: Internal Medicine

## 2022-06-16 VITALS — BP 120/76 | HR 63 | Temp 97.8°F | Ht 60.0 in | Wt 125.0 lb

## 2022-06-16 DIAGNOSIS — E559 Vitamin D deficiency, unspecified: Secondary | ICD-10-CM

## 2022-06-16 DIAGNOSIS — G8929 Other chronic pain: Secondary | ICD-10-CM | POA: Diagnosis not present

## 2022-06-16 DIAGNOSIS — G243 Spasmodic torticollis: Secondary | ICD-10-CM | POA: Diagnosis not present

## 2022-06-16 DIAGNOSIS — M79662 Pain in left lower leg: Secondary | ICD-10-CM

## 2022-06-16 DIAGNOSIS — M546 Pain in thoracic spine: Secondary | ICD-10-CM | POA: Diagnosis not present

## 2022-06-16 DIAGNOSIS — G250 Essential tremor: Secondary | ICD-10-CM | POA: Diagnosis not present

## 2022-06-16 MED ORDER — PRIMIDONE 50 MG PO TABS: 25.0000 mg | ORAL_TABLET | Freq: Two times a day (BID) | ORAL | 1 refills | Status: DC | PRN

## 2022-06-16 MED ORDER — DIAZEPAM 2 MG PO TABS: 2.0000 mg | ORAL_TABLET | Freq: Every day | ORAL | 1 refills | Status: DC

## 2022-06-16 NOTE — Patient Instructions (Signed)
Blue-Emu cream --  use 2-3 times a day Salonpas patches   
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
## 2022-06-17 DIAGNOSIS — M546 Pain in thoracic spine: Secondary | ICD-10-CM | POA: Insufficient documentation

## 2022-06-17 NOTE — Assessment & Plan Note (Signed)
F/u w/Dr Tat - I encouraged the patient and her son to consider Botox injections Restart diazepam low dose at at bedtime-it helped a lot Cont w/Primidone at low-dose if tolerated (if she was intolerant of higher doses) 
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
## 2022-06-17 NOTE — Assessment & Plan Note (Signed)
F/u w/Dr Tat - I encouraged the patient and her son to consider Botox injections Restart diazepam low dose at at bedtime-it helped a lot Cont w/Primidone at low-dose if tolerated (if she was intolerant of higher doses)

## 2022-06-17 NOTE — Assessment & Plan Note (Signed)
Continue with vitamin D 

## 2022-06-19 ENCOUNTER — Telehealth: Payer: Self-pay | Admitting: Internal Medicine

## 2022-06-19 ENCOUNTER — Ambulatory Visit: Payer: Medicaid Other | Admitting: Physical Therapy

## 2022-06-19 DIAGNOSIS — Z111 Encounter for screening for respiratory tuberculosis: Secondary | ICD-10-CM | POA: Diagnosis not present

## 2022-06-19 NOTE — Telephone Encounter (Signed)
Place form on MD desk../lmb 

## 2022-06-19 NOTE — Telephone Encounter (Signed)
Son came to the office with 'Assessment for Personal Care Attestation of Medical Need' forms. He said the forms are completely filled out and the copy of pt insurance card is included as well. He says [employer] is going to call Dr. Posey Rea and personally give him a fax number to fax the forms with copy of the insurance card to them.  Please call pt son for better understanding.  [phone]

## 2022-06-19 NOTE — Telephone Encounter (Signed)
PT's son today calls today in regards to a statement Dr.[name] had made during PT's last appointment. PT's son stated Dr.[name] would not recommend the polio vaccine to PT due to PT's age. PT's son is wanting to know if we could get this in writing with Dr.[name]'s signature.  If Dr.[name] has any questions PT can be called at  [facility]: [phone]

## 2022-06-21 NOTE — Telephone Encounter (Signed)
The letter was dictated.  Please print.  Thank you

## 2022-06-22 NOTE — Telephone Encounter (Signed)
Notified pt son letter ready for pick-up.Marland KitchenRaechel Chute

## 2022-06-26 ENCOUNTER — Ambulatory Visit
Admission: RE | Admit: 2022-06-26 | Discharge: 2022-06-26 | Disposition: A | Discharge status: Home / Self Care | Payer: No Typology Code available for payment source | Source: Ambulatory Visit | Attending: Obstetrics and Gynecology | Admitting: Obstetrics and Gynecology

## 2022-06-26 ENCOUNTER — Other Ambulatory Visit: Payer: Self-pay | Admitting: Obstetrics and Gynecology

## 2022-06-26 ENCOUNTER — Ambulatory Visit: Payer: Medicaid Other

## 2022-06-26 DIAGNOSIS — R7612 Nonspecific reaction to cell mediated immunity measurement of gamma interferon antigen response without active tuberculosis: Secondary | ICD-10-CM | POA: Diagnosis not present

## 2022-06-26 DIAGNOSIS — Z111 Encounter for screening for respiratory tuberculosis: Secondary | ICD-10-CM | POA: Diagnosis not present

## 2022-06-26 NOTE — Therapy (Unsigned)
[facility] [address] [place], [place], [address] Phone: [phone]   Fax:  [phone]  Patient Details  Name: Crystal Rodgers MRN: [id number] Date of Birth: 03/30/1933 Referring Provider:  No ref. provider found  Encounter Date: 06/26/2022  PHYSICAL THERAPY DISCHARGE SUMMARY  Visits from Start of Care: 3  Current functional level related to goals / functional outcomes: 14/30 on the FGA; ModI   Remaining deficits: impaired balance, pain   Education / Equipment: PT POC, HEP, fall precautions   Patient agrees to discharge. Patient goals were  unable to be assessed sa patient did not return since last visit . Patient is being discharged due to not returning since the last visit.  Westley Foots, PT Westley Foots, PT, DPT, CBIS  06/26/2022, 11:21 AM  [facility] [address] [place], [place], [address] Phone: [phone]   Fax:  [phone]

## 2022-07-14 NOTE — Telephone Encounter (Signed)
Patient's son, Essie Christine (on [place]) came by stating that Sharyn Lull from [facility] spoke to someone in the office today regarding additional information needed to approve patient's personal care services. The [facility] number is [phone]. Patient's son is requesting we call to find out what additional information is needed. He is also requesting call back at [phone].

## 2022-07-16 NOTE — Telephone Encounter (Signed)
Called [employer] to try to get more information pn form. Spoke w/ Larena Glassman she states we would have to talk w/ care management @ [phone]. Ask Larena Glassman from her side could she see that [facility] fors was faxed back in August. She states she could not tell. The patient would have to call the member line to request care management to call.Crystal KitchenJohny Chess

## 2022-07-17 NOTE — Telephone Encounter (Signed)
Called the care management line had to leave a msg for them to give [place] a call back. Called son to let him know the status. Pt nor I don't understand why services was denied. Pt states he will contact them.Marland KitchenJohny Rodgers

## 2022-10-26 ENCOUNTER — Ambulatory Visit (INDEPENDENT_AMBULATORY_CARE_PROVIDER_SITE_OTHER): Payer: Medicaid Other | Admitting: Internal Medicine

## 2022-10-26 ENCOUNTER — Encounter: Payer: Self-pay | Admitting: Internal Medicine

## 2022-10-26 VITALS — BP 130/82 | HR 64 | Temp 97.9°F | Ht 60.0 in | Wt 124.0 lb

## 2022-10-26 DIAGNOSIS — L309 Dermatitis, unspecified: Secondary | ICD-10-CM

## 2022-10-26 DIAGNOSIS — R202 Paresthesia of skin: Secondary | ICD-10-CM

## 2022-10-26 DIAGNOSIS — G250 Essential tremor: Secondary | ICD-10-CM | POA: Diagnosis not present

## 2022-10-26 DIAGNOSIS — E559 Vitamin D deficiency, unspecified: Secondary | ICD-10-CM

## 2022-10-26 DIAGNOSIS — R270 Ataxia, unspecified: Secondary | ICD-10-CM

## 2022-10-26 DIAGNOSIS — G243 Spasmodic torticollis: Secondary | ICD-10-CM | POA: Diagnosis not present

## 2022-10-26 DIAGNOSIS — G4700 Insomnia, unspecified: Secondary | ICD-10-CM

## 2022-10-26 DIAGNOSIS — S0990XA Unspecified injury of head, initial encounter: Secondary | ICD-10-CM

## 2022-10-26 LAB — COMPREHENSIVE METABOLIC PANEL
ALT: 11 U/L (ref 0–35)
AST: 18 U/L (ref 0–37)
Albumin: 3.9 g/dL (ref 3.5–5.2)
Alkaline Phosphatase: 76 U/L (ref 39–117)
BUN: 13 mg/dL (ref 6–23)
CO2: 28 mEq/L (ref 19–32)
Calcium: 8.9 mg/dL (ref 8.4–10.5)
Chloride: 106 mEq/L (ref 96–112)
Creatinine, Ser: 0.71 mg/dL (ref 0.40–1.20)
GFR: 76.1 mL/min (ref >60.00)
Glucose, Bld: 76 mg/dL (ref 70–99)
Potassium: 3.8 mEq/L (ref 3.5–5.1)
Sodium: 142 mEq/L (ref 135–145)
Total Bilirubin: 0.5 mg/dL (ref 0.2–1.2)
Total Protein: 6.8 g/dL (ref 6.0–8.3)

## 2022-10-26 LAB — CBC WITH DIFFERENTIAL/PLATELET
Basophils Absolute: 0 10*3/uL (ref 0.0–0.1)
Basophils Relative: 0.5 % (ref 0.0–3.0)
Eosinophils Absolute: 0.2 10*3/uL (ref 0.0–0.7)
Eosinophils Relative: 3.3 % (ref 0.0–5.0)
HCT: 39.8 % (ref 36.0–46.0)
Hemoglobin: 13.1 g/dL (ref 12.0–15.0)
Lymphocytes Relative: 41.1 % (ref 12.0–46.0)
Lymphs Abs: 2.4 10*3/uL (ref 0.7–4.0)
MCHC: 32.9 g/dL (ref 30.0–36.0)
MCV: 76.3 fl — ABNORMAL LOW (ref 78.0–100.0)
Monocytes Absolute: 0.5 10*3/uL (ref 0.1–1.0)
Monocytes Relative: 8.9 % (ref 3.0–12.0)
Neutro Abs: 2.7 10*3/uL (ref 1.4–7.7)
Neutrophils Relative %: 46.2 % (ref 43.0–77.0)
Platelets: 199 10*3/uL (ref 150.0–400.0)
RBC: 5.21 Mil/uL — ABNORMAL HIGH (ref 3.87–5.11)
RDW: 19.5 % — ABNORMAL HIGH (ref 11.5–15.5)
WBC: 5.9 10*3/uL (ref 4.0–10.5)

## 2022-10-26 LAB — VITAMIN D 25 HYDROXY (VIT D DEFICIENCY, FRACTURES): VITD: 17.78 ng/mL — ABNORMAL LOW (ref 30.00–100.00)

## 2022-10-26 LAB — TSH: TSH: 2.57 u[IU]/mL (ref 0.35–5.50)

## 2022-10-26 MED ORDER — TRIAMCINOLONE ACETONIDE 0.5 % EX CREA: 1.0000 | TOPICAL_CREAM | Freq: Three times a day (TID) | CUTANEOUS | 1 refills | Status: AC

## 2022-10-26 MED ORDER — KETOTIFEN FUMARATE 0.035 % OP SOLN: 2.0000 [drp] | Freq: Two times a day (BID) | OPHTHALMIC | 3 refills | Status: AC

## 2022-10-26 MED ORDER — FLUTICASONE PROPIONATE 50 MCG/ACT NA SUSP: 2.0000 | Freq: Every day | NASAL | 6 refills | Status: DC

## 2022-10-26 MED ORDER — VITAMIN D (ERGOCALCIFEROL) 1.25 MG (50000 UNIT) PO CAPS: 50000.0000 [IU] | ORAL_CAPSULE | ORAL | 1 refills | Status: DC

## 2022-10-26 MED ORDER — VITAMIN D3 50 MCG (2000 UT) PO CAPS: 2000.0000 [IU] | ORAL_CAPSULE | Freq: Every day | ORAL | 3 refills | Status: DC

## 2022-10-26 MED ORDER — DIAZEPAM 2 MG PO TABS
2.0000 mg | ORAL_TABLET | Freq: Every day | ORAL | 1 refills | Status: DC
Start: 2022-10-26 — End: 2023-05-11

## 2022-10-26 MED ORDER — LORATADINE 10 MG PO TABS: 10.0000 mg | ORAL_TABLET | Freq: Every day | ORAL | 11 refills | Status: AC

## 2022-10-26 NOTE — Assessment & Plan Note (Signed)
Neck Triamc cream tid

## 2022-10-26 NOTE — Patient Instructions (Signed)
You have a low Vitamin D levels - please, start Vit D prescription 50000 iu weekly (Rx emailed to your pharmacy) followed by over-the-counter Vit D 2000 iu daily.  

## 2022-10-26 NOTE — Assessment & Plan Note (Signed)
diazepam low dose at at bedtime-it helped a lot Cont w/Primidone at low-dose if tolerated (if she was intolerant of higher doses)

## 2022-10-26 NOTE — Progress Notes (Signed)
Subjective:  Patient ID: Crystal Rodgers, female    DOB: 08-Apr-1933  Age: 89 y.o. MRN: [id number]  CC: Hypertension (WEAKNESS IN LEGS)   HPI Crystal Rodgers presents for runny nose, skin rash on neck, itchy eyes C/o elevated BP 150 x 1 week, resolved C/o not sleeping at all, tired Stopped Diazepam 2 mo ago - ?reason  Outpatient Medications Prior to Visit  Medication Sig Dispense Refill   AMBULATORY NON FORMULARY MEDICATION Walker; 2 wheels, 2 tennis balls Dx: gait instability R26.81 1 Device 0   aspirin EC 81 MG tablet Take 81 mg by mouth daily. Swallow whole.     primidone (MYSOLINE) 50 MG tablet Take 0.5 tablets (25 mg total) by mouth 2 (two) times daily as needed. 90 tablet 1   diazepam (VALIUM) 2 MG tablet Take 1 tablet (2 mg total) by mouth at bedtime. 90 tablet 1   B Complex-Folic Acid (B COMPLEX PLUS) TABS Take 1 tablet by mouth daily. (Patient not taking: Reported on 06/16/2022) 100 tablet 3   Cholecalciferol (VITAMIN D3) 50 MCG (2000 UT) capsule Take 1 capsule (2,000 Units total) by mouth daily. (Patient not taking: Reported on 06/16/2022) 100 capsule 3   Vitamin D, Ergocalciferol, (DRISDOL) 1.25 MG (50000 UNIT) CAPS capsule Take 1 capsule (50,000 Units total) by mouth every 7 (seven) days. (Patient not taking: Reported on 06/16/2022) 8 capsule 0   No facility-administered medications prior to visit.    ROS: Review of Systems  Constitutional:  Positive for fatigue. Negative for activity change, appetite change, chills and unexpected weight change.  HENT:  Positive for rhinorrhea. Negative for congestion, mouth sores and sinus pressure.   Eyes:  Negative for visual disturbance.  Respiratory:  Negative for cough and chest tightness.   Gastrointestinal:  Negative for abdominal pain and nausea.  Genitourinary:  Negative for difficulty urinating, frequency and vaginal pain.  Musculoskeletal:  Negative for back pain and gait problem.  Skin:  Negative for pallor and rash.   Neurological:  Negative for dizziness, tremors, weakness, numbness and headaches.  Psychiatric/Behavioral:  Negative for confusion and sleep disturbance.     Objective:  BP 130/82 (BP Location: Right Arm, Patient Position: Sitting, Cuff Size: Large)   Pulse 64   Temp 97.9 F (36.6 C) (Oral)   Ht 5' (1.524 m)   Wt 124 lb (56.2 kg)   LMP  (LMP Unknown)   SpO2 93%   BMI 24.22 kg/m   BP Readings from Last 3 Encounters:  10/26/22 130/82  06/16/22 120/76  05/29/22 139/76    Wt Readings from Last 3 Encounters:  10/26/22 124 lb (56.2 kg)  06/16/22 125 lb (56.7 kg)  05/15/22 126 lb (57.2 kg)    Physical Exam Constitutional:      General: She is not in acute distress.    Appearance: Normal appearance. She is well-developed.  HENT:     Head: Normocephalic.     Right Ear: External ear normal.     Left Ear: External ear normal.     Nose: Nose normal.  Eyes:     General:        Right eye: No discharge.        Left eye: No discharge.     Conjunctiva/sclera: Conjunctivae normal.     Pupils: Pupils are equal, round, and reactive to light.  Neck:     Thyroid: No thyromegaly.     Vascular: No JVD.     Trachea: No tracheal deviation.  Cardiovascular:  Rate and Rhythm: Normal rate and regular rhythm.     Heart sounds: Normal heart sounds.  Pulmonary:     Effort: No respiratory distress.     Breath sounds: No stridor. No wheezing.  Abdominal:     General: Bowel sounds are normal. There is no distension.     Palpations: Abdomen is soft. There is no mass.     Tenderness: There is no abdominal tenderness. There is no guarding or rebound.  Musculoskeletal:        General: No tenderness.     Cervical back: Normal range of motion and neck supple. No rigidity.  Lymphadenopathy:     Cervical: No cervical adenopathy.  Skin:    Findings: No erythema or rash.  Neurological:     Mental Status: She is oriented to person, place, and time.     Cranial Nerves: No cranial nerve  deficit.     Motor: No abnormal muscle tone.     Coordination: Coordination normal.     Deep Tendon Reflexes: Reflexes normal.  Psychiatric:        Behavior: Behavior normal.        Thought Content: Thought content normal.        Judgment: Judgment normal.   Head tremor Hand tremor ataxic Neck rash - 2 patches   Lab Results  Component Value Date   WBC 7.0 10/29/2021   HGB 13.7 10/29/2021   HCT 42.2 10/29/2021   PLT 193.0 10/29/2021   GLUCOSE 99 11/21/2021   CHOL 184 10/29/2021   TRIG 204.0 (H) 10/29/2021   HDL 56.30 10/29/2021   LDLDIRECT 115.0 10/29/2021   ALT 15 11/21/2021   AST 22 11/21/2021   NA 143 11/21/2021   K 4.0 11/21/2021   CL 108 11/21/2021   CREATININE 0.78 11/21/2021   BUN 25 (H) 11/21/2021   CO2 26 11/21/2021   TSH 3.30 11/21/2021    DG Chest 1 View  Result Date: 06/28/2022 CLINICAL DATA:  Positive PPD. EXAM: CHEST  1 VIEW COMPARISON:  Limited correlation made with thoracic spine radiographs 06/16/2022. FINDINGS: The heart size and mediastinal contours are stable with diffuse aortic tortuosity and a moderate size hiatal hernia. There is mild linear atelectasis or scarring at both lung bases. No confluent airspace opacity, pleural effusion or pneumothorax. Old rib fractures are noted on the left. The thoracic compression deformities seen on prior radiographs are not well seen on this single PA view. IMPRESSION: No radiographic evidence of tuberculosis or active cardiopulmonary process. Electronically Signed   By: Carey Bullocks M.D.   On: 06/28/2022 11:43    Assessment & Plan:   Problem List Items Addressed This Visit       Nervous and Auditory   Cervical dystonia    Re-start Diazepam  Potential benefits of a long term benzodiazepines  use as well as potential risks  and complications were explained to the patient and were aknowledged.      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      Relevant Orders   VITAMIN D 25 Hydroxy (Vit-D Deficiency, Fractures)   TSH   Comprehensive metabolic panel   CBC with Differential/Platelet   Insomnia    Stopped Diazepam 2 mo ago - ?reason We will re-start a low-dose Valium.  Hopefully it will help her tremors as well  Potential benefits of a long term benzodiazepines  use as well as potential risks  and complications were explained to the patient and were aknowledged.      Paresthesia   Relevant Orders   TSH   Vitamin D deficiency   Relevant Orders   VITAMIN D 25 Hydroxy (Vit-D Deficiency, Fractures)      Meds ordered this encounter  Medications   loratadine (CLARITIN) 10 MG tablet    Sig: Take 1 tablet (10 mg total) by mouth daily.    Dispense:  30 tablet    Refill:  11   fluticasone (FLONASE) 50 MCG/ACT nasal spray    Sig: Place 2 sprays into both nostrils daily.    Dispense:  16 g    Refill:  6   diazepam (VALIUM) 2 MG tablet    Sig: Take 1 tablet (2 mg total) by mouth at bedtime.    Dispense:  90 tablet    Refill:  1   ketotifen (ZADITOR) 0.035 % ophthalmic solution    Sig: Place 2 drops into both eyes in the morning and at bedtime.    Dispense:  10 mL    Refill:  3   triamcinolone cream (KENALOG) 0.5 %    Sig: Apply 1 Application topically 3 (three) times daily.    Dispense:  120 g    Refill:  1   Vitamin D, Ergocalciferol, (DRISDOL) 1.25 MG (50000 UNIT) CAPS capsule    Sig: Take 1 capsule (50,000  Units total) by mouth every 7 (seven) days.    Dispense:  6 capsule    Refill:  1   Cholecalciferol (VITAMIN D3) 50 MCG (2000 UT) CAPS    Sig: Take 1 capsule (2,000 Units total) by mouth daily.    Dispense:  100 capsule    Refill:  3       Follow-up: Return in about 6 weeks (around 12/07/2022) for a follow-up visit.  Sonda Primes, MD

## 2022-10-26 NOTE — Assessment & Plan Note (Signed)
Re-start Diazepam  Potential benefits of a long term benzodiazepines  use as well as potential risks  and complications were explained to the patient and were aknowledged.

## 2022-10-26 NOTE — Assessment & Plan Note (Signed)
Worse w/insomnia Re-start Diazepam  Potential benefits of a long term benzodiazepines  use as well as potential risks  and complications were explained to the patient and were aknowledged.

## 2022-10-26 NOTE — Assessment & Plan Note (Signed)
Stopped Diazepam 2 mo ago - ?reason We will re-start a low-dose Valium.  Hopefully it will help her tremors as well  Potential benefits of a long term benzodiazepines  use as well as potential risks  and complications were explained to the patient and were aknowledged.

## 2022-11-09 ENCOUNTER — Other Ambulatory Visit: Payer: Self-pay | Admitting: Neurology

## 2022-11-19 ENCOUNTER — Telehealth: Payer: Self-pay | Admitting: Internal Medicine

## 2022-11-19 NOTE — Telephone Encounter (Signed)
Patient's son [name] brought in paperwork for Personal Care Services - states that current paperwork is expired and needs to be updated. He dropped off new paperwork - patient saw Dr. Posey Rea on 10/26/22 - someone was also supposed to have faxed [place] paperwork on 5/6.  Please call [name] with an update at [phone].  Thank you!

## 2022-11-19 NOTE — Telephone Encounter (Signed)
Place form in MD purple folder to complete../lmb 

## 2022-11-20 ENCOUNTER — Telehealth: Payer: Self-pay | Admitting: Internal Medicine

## 2022-11-20 NOTE — Telephone Encounter (Signed)
[employer] rep  called checking on the status on the completion of the personal care service forms.

## 2022-11-23 NOTE — Telephone Encounter (Signed)
Pt's son, [name], has picked up forms and was told that they have been faxed.

## 2022-11-23 NOTE — Telephone Encounter (Signed)
Faxed PCS form back to [phone].Marland KitchenRaechel Chute

## 2022-11-25 NOTE — Telephone Encounter (Signed)
I think I have signed it already.  Thank you

## 2022-11-25 NOTE — Telephone Encounter (Signed)
You did.. form was faxed back to [facility]. [name] was notified to pick-up original../lmb
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
## 2022-11-27 ENCOUNTER — Ambulatory Visit: Payer: Medicaid Other | Admitting: Neurology

## 2022-11-27 ENCOUNTER — Encounter: Payer: Self-pay | Admitting: Neurology

## 2022-11-27 DIAGNOSIS — H2513 Age-related nuclear cataract, bilateral: Secondary | ICD-10-CM | POA: Diagnosis not present

## 2022-11-27 DIAGNOSIS — H5703 Miosis: Secondary | ICD-10-CM | POA: Diagnosis not present

## 2022-11-27 DIAGNOSIS — H35362 Drusen (degenerative) of macula, left eye: Secondary | ICD-10-CM | POA: Diagnosis not present

## 2022-11-27 DIAGNOSIS — H52202 Unspecified astigmatism, left eye: Secondary | ICD-10-CM | POA: Diagnosis not present

## 2022-11-27 DIAGNOSIS — H3562 Retinal hemorrhage, left eye: Secondary | ICD-10-CM | POA: Diagnosis not present

## 2022-11-27 DIAGNOSIS — H5201 Hypermetropia, right eye: Secondary | ICD-10-CM | POA: Diagnosis not present

## 2022-11-27 DIAGNOSIS — H524 Presbyopia: Secondary | ICD-10-CM | POA: Diagnosis not present

## 2022-12-23 DIAGNOSIS — Z87820 Personal history of traumatic brain injury: Secondary | ICD-10-CM | POA: Diagnosis not present

## 2022-12-24 DIAGNOSIS — Z87820 Personal history of traumatic brain injury: Secondary | ICD-10-CM | POA: Diagnosis not present

## 2022-12-25 DIAGNOSIS — Z87820 Personal history of traumatic brain injury: Secondary | ICD-10-CM | POA: Diagnosis not present

## 2022-12-28 DIAGNOSIS — Z87820 Personal history of traumatic brain injury: Secondary | ICD-10-CM | POA: Diagnosis not present

## 2022-12-29 DIAGNOSIS — Z87820 Personal history of traumatic brain injury: Secondary | ICD-10-CM | POA: Diagnosis not present

## 2023-01-04 DIAGNOSIS — Z87820 Personal history of traumatic brain injury: Secondary | ICD-10-CM | POA: Diagnosis not present

## 2023-01-05 DIAGNOSIS — Z87820 Personal history of traumatic brain injury: Secondary | ICD-10-CM | POA: Diagnosis not present

## 2023-01-06 DIAGNOSIS — Z87820 Personal history of traumatic brain injury: Secondary | ICD-10-CM | POA: Diagnosis not present

## 2023-01-07 DIAGNOSIS — Z87820 Personal history of traumatic brain injury: Secondary | ICD-10-CM | POA: Diagnosis not present

## 2023-01-08 DIAGNOSIS — Z87820 Personal history of traumatic brain injury: Secondary | ICD-10-CM | POA: Diagnosis not present

## 2023-01-09 DIAGNOSIS — Z87820 Personal history of traumatic brain injury: Secondary | ICD-10-CM | POA: Diagnosis not present

## 2023-01-10 DIAGNOSIS — Z87820 Personal history of traumatic brain injury: Secondary | ICD-10-CM | POA: Diagnosis not present

## 2023-01-11 DIAGNOSIS — Z87820 Personal history of traumatic brain injury: Secondary | ICD-10-CM | POA: Diagnosis not present

## 2023-01-12 DIAGNOSIS — Z87820 Personal history of traumatic brain injury: Secondary | ICD-10-CM | POA: Diagnosis not present

## 2023-01-13 DIAGNOSIS — Z87820 Personal history of traumatic brain injury: Secondary | ICD-10-CM | POA: Diagnosis not present

## 2023-01-14 DIAGNOSIS — Z87820 Personal history of traumatic brain injury: Secondary | ICD-10-CM | POA: Diagnosis not present

## 2023-01-15 DIAGNOSIS — Z87820 Personal history of traumatic brain injury: Secondary | ICD-10-CM | POA: Diagnosis not present

## 2023-01-16 DIAGNOSIS — Z87820 Personal history of traumatic brain injury: Secondary | ICD-10-CM | POA: Diagnosis not present

## 2023-01-17 DIAGNOSIS — Z87820 Personal history of traumatic brain injury: Secondary | ICD-10-CM | POA: Diagnosis not present

## 2023-01-18 DIAGNOSIS — Z87820 Personal history of traumatic brain injury: Secondary | ICD-10-CM | POA: Diagnosis not present

## 2023-01-19 DIAGNOSIS — Z87820 Personal history of traumatic brain injury: Secondary | ICD-10-CM | POA: Diagnosis not present

## 2023-01-20 DIAGNOSIS — Z87820 Personal history of traumatic brain injury: Secondary | ICD-10-CM | POA: Diagnosis not present

## 2023-01-21 DIAGNOSIS — Z87820 Personal history of traumatic brain injury: Secondary | ICD-10-CM | POA: Diagnosis not present

## 2023-01-22 DIAGNOSIS — Z87820 Personal history of traumatic brain injury: Secondary | ICD-10-CM | POA: Diagnosis not present

## 2023-01-23 DIAGNOSIS — Z87820 Personal history of traumatic brain injury: Secondary | ICD-10-CM | POA: Diagnosis not present

## 2023-01-24 DIAGNOSIS — Z87820 Personal history of traumatic brain injury: Secondary | ICD-10-CM | POA: Diagnosis not present

## 2023-01-25 DIAGNOSIS — Z87820 Personal history of traumatic brain injury: Secondary | ICD-10-CM | POA: Diagnosis not present

## 2023-01-26 DIAGNOSIS — Z87820 Personal history of traumatic brain injury: Secondary | ICD-10-CM | POA: Diagnosis not present

## 2023-01-27 DIAGNOSIS — Z87820 Personal history of traumatic brain injury: Secondary | ICD-10-CM | POA: Diagnosis not present

## 2023-01-28 DIAGNOSIS — Z87820 Personal history of traumatic brain injury: Secondary | ICD-10-CM | POA: Diagnosis not present

## 2023-01-29 DIAGNOSIS — Z87820 Personal history of traumatic brain injury: Secondary | ICD-10-CM | POA: Diagnosis not present

## 2023-01-30 DIAGNOSIS — Z87820 Personal history of traumatic brain injury: Secondary | ICD-10-CM | POA: Diagnosis not present

## 2023-01-31 DIAGNOSIS — Z87820 Personal history of traumatic brain injury: Secondary | ICD-10-CM | POA: Diagnosis not present

## 2023-02-01 DIAGNOSIS — Z87820 Personal history of traumatic brain injury: Secondary | ICD-10-CM | POA: Diagnosis not present

## 2023-02-02 DIAGNOSIS — Z87820 Personal history of traumatic brain injury: Secondary | ICD-10-CM | POA: Diagnosis not present

## 2023-02-03 DIAGNOSIS — Z87820 Personal history of traumatic brain injury: Secondary | ICD-10-CM | POA: Diagnosis not present

## 2023-02-04 DIAGNOSIS — Z87820 Personal history of traumatic brain injury: Secondary | ICD-10-CM | POA: Diagnosis not present

## 2023-02-05 ENCOUNTER — Other Ambulatory Visit: Payer: Self-pay | Admitting: Neurology

## 2023-02-05 DIAGNOSIS — G250 Essential tremor: Secondary | ICD-10-CM

## 2023-02-05 DIAGNOSIS — G243 Spasmodic torticollis: Secondary | ICD-10-CM

## 2023-02-05 DIAGNOSIS — Z87820 Personal history of traumatic brain injury: Secondary | ICD-10-CM | POA: Diagnosis not present

## 2023-02-06 DIAGNOSIS — Z87820 Personal history of traumatic brain injury: Secondary | ICD-10-CM | POA: Diagnosis not present

## 2023-02-07 DIAGNOSIS — Z87820 Personal history of traumatic brain injury: Secondary | ICD-10-CM | POA: Diagnosis not present

## 2023-02-08 DIAGNOSIS — Z87820 Personal history of traumatic brain injury: Secondary | ICD-10-CM | POA: Diagnosis not present

## 2023-02-09 DIAGNOSIS — Z87820 Personal history of traumatic brain injury: Secondary | ICD-10-CM | POA: Diagnosis not present

## 2023-02-10 ENCOUNTER — Encounter: Payer: Self-pay | Admitting: Internal Medicine

## 2023-02-10 ENCOUNTER — Ambulatory Visit: Payer: Medicaid Other

## 2023-02-10 ENCOUNTER — Ambulatory Visit (INDEPENDENT_AMBULATORY_CARE_PROVIDER_SITE_OTHER): Payer: Medicaid Other

## 2023-02-10 ENCOUNTER — Ambulatory Visit (INDEPENDENT_AMBULATORY_CARE_PROVIDER_SITE_OTHER): Payer: Medicaid Other | Admitting: Internal Medicine

## 2023-02-10 VITALS — BP 110/70 | HR 56 | Temp 98.6°F | Ht 60.0 in | Wt 119.0 lb

## 2023-02-10 DIAGNOSIS — M85862 Other specified disorders of bone density and structure, left lower leg: Secondary | ICD-10-CM | POA: Diagnosis not present

## 2023-02-10 DIAGNOSIS — G8929 Other chronic pain: Secondary | ICD-10-CM

## 2023-02-10 DIAGNOSIS — M25562 Pain in left knee: Secondary | ICD-10-CM

## 2023-02-10 DIAGNOSIS — G250 Essential tremor: Secondary | ICD-10-CM

## 2023-02-10 DIAGNOSIS — Z87820 Personal history of traumatic brain injury: Secondary | ICD-10-CM | POA: Diagnosis not present

## 2023-02-10 DIAGNOSIS — E559 Vitamin D deficiency, unspecified: Secondary | ICD-10-CM | POA: Diagnosis not present

## 2023-02-10 DIAGNOSIS — G4700 Insomnia, unspecified: Secondary | ICD-10-CM | POA: Diagnosis not present

## 2023-02-10 DIAGNOSIS — R251 Tremor, unspecified: Secondary | ICD-10-CM

## 2023-02-10 MED ORDER — VITAMIN D (ERGOCALCIFEROL) 1.25 MG (50000 UNIT) PO CAPS: 50000.0000 [IU] | ORAL_CAPSULE | ORAL | 3 refills | Status: DC

## 2023-02-10 MED ORDER — MELOXICAM 7.5 MG PO TABS: 7.5000 mg | ORAL_TABLET | Freq: Every evening | ORAL | 5 refills | Status: DC

## 2023-02-10 MED ORDER — LIDOCAINE HCL 2 % IJ SOLN
20.0000 mL | Freq: Once | INTRAMUSCULAR | Status: AC
  Administered 2023-02-10: 400 mg

## 2023-02-10 MED ORDER — METHYLPREDNISOLONE ACETATE 40 MG/ML IJ SUSP
40.0000 mg | Freq: Once | INTRAMUSCULAR | Status: AC
  Administered 2023-02-10: 40 mg via INTRAMUSCULAR

## 2023-02-10 NOTE — Assessment & Plan Note (Signed)
F/u w/Dr Tat On Primidone

## 2023-02-10 NOTE — Assessment & Plan Note (Signed)
We will use a low-dose Valium.  Hopefully it will help her tremors as well ? Potential benefits of a long term benzodiazepines  use as well as potential risks  and complications were explained to the patient and were aknowledged. ? ?

## 2023-02-10 NOTE — Assessment & Plan Note (Signed)
Continue with vitamin D 62376 biweekly

## 2023-02-10 NOTE — Progress Notes (Signed)
Subjective:  Patient ID: Crystal Rodgers, female    DOB: [birth year]  Age: 89 y.o. MRN: [id number]  CC: Knee Pain (Left Knee pains)   HPI [name] presents for L knee pain is worse   Outpatient Medications Prior to Visit  Medication Sig Dispense Refill   AMBULATORY NON FORMULARY MEDICATION Walker; 2 wheels, 2 tennis balls Dx: gait instability R26.81 1 Device 0   aspirin EC 81 MG tablet Take 81 mg by mouth daily. Swallow whole.     Cholecalciferol (VITAMIN D3) 50 MCG (2000 UT) capsule Take 1 capsule (2,000 Units total) by mouth daily. 100 capsule 3   diazepam (VALIUM) 2 MG tablet Take 1 tablet (2 mg total) by mouth at bedtime. 90 tablet 1   fluticasone (FLONASE) 50 MCG/ACT nasal spray Place 2 sprays into both nostrils daily. 16 g 6   ketotifen (ZADITOR) 0.035 % ophthalmic solution Place 2 drops into both eyes in the morning and at bedtime. 10 mL 3   loratadine (CLARITIN) 10 MG tablet Take 1 tablet (10 mg total) by mouth daily. 30 tablet 11   primidone (MYSOLINE) 50 MG tablet TAKE 2 TABLETS BY MOUTH IN THE MORNING TAKE 1 TABLET AT BED 270 tablet 0   triamcinolone cream (KENALOG) 0.5 % Apply 1 Application topically 3 (three) times daily. 120 g 1   Cholecalciferol (VITAMIN D3) 50 MCG (2000 UT) CAPS Take 1 capsule (2,000 Units total) by mouth daily. 100 capsule 3   Vitamin D, Ergocalciferol, (DRISDOL) 1.25 MG (50000 UNIT) CAPS capsule Take 1 capsule (50,000 Units total) by mouth every 7 (seven) days. 8 capsule 0   Vitamin D, Ergocalciferol, (DRISDOL) 1.25 MG (50000 UNIT) CAPS capsule Take 1 capsule (50,000 Units total) by mouth every 7 (seven) days. 6 capsule 1   B Complex-Folic Acid (B COMPLEX PLUS) TABS Take 1 tablet by mouth daily. (Patient not taking: Reported on 06/16/2022) 100 tablet 3   No facility-administered medications prior to visit.    ROS: Review of Systems  Objective:  BP 110/70 (BP Location: Right Arm, Patient Position: Sitting, Cuff Size: Large)   Pulse (!)  56   Temp 98.6 F (37 C) (Oral)   Ht 5' (1.524 m)   Wt 119 lb (54 kg)   LMP  (LMP Unknown)   SpO2 94%   BMI 23.24 kg/m   BP Readings from Last 3 Encounters:  02/10/23 110/70  10/26/22 130/82  06/16/22 120/76    Wt Readings from Last 3 Encounters:  02/10/23 119 lb (54 kg)  10/26/22 124 lb (56.2 kg)  06/16/22 125 lb (56.7 kg)    Physical Exam  L kne w/pain   Procedure Note :     Procedure : Joint Injection,   knee   Indication:  Joint osteoarthritis with refractory  chronic pain.   Risks including unsuccessful procedure , bleeding, infection, bruising, skin atrophy, "steroid flare-up" and others were explained to the patient in detail as well as the benefits. Informed consent was obtained verbally.  Tthe patient was placed in a comfortable position. Lateral approach was used. Skin was prepped with Betadine and alcohol  and anesthetized a cooling spray. Then, a 5 cc syringe with a 1.5 inch long 25-gauge needle was used for a joint injection.. The needle was advanced  Into the knee joint cavity. I aspirated a small amount of intra-articular fluid to confirm correct placement of the needle and injected the joint with 5 mL of 2% lidocaine and 40 mg of Depo-Medrol .  Band-Aid was applied.   Tolerated well. Complications: None. Good pain relief following the procedure.   Postprocedure instructions :    A Band-Aid should be left on for 12 hours. Injection therapy is not a cure itself. It is used in conjunction with other modalities. You can use nonsteroidal anti-inflammatories like ibuprofen , hot and cold compresses. Rest is recommended in the next 24 hours. You need to report immediately  if fever, chills or any signs of infection develop.   Lab Results  Component Value Date   WBC 5.9 10/26/2022   HGB 13.1 10/26/2022   HCT 39.8 10/26/2022   PLT 199.0 10/26/2022   GLUCOSE 76 10/26/2022   CHOL 184 10/29/2021   TRIG 204.0 (H) 10/29/2021   HDL 56.30 10/29/2021   LDLDIRECT  115.0 10/29/2021   ALT 11 10/26/2022   AST 18 10/26/2022   NA 142 10/26/2022   K 3.8 10/26/2022   CL 106 10/26/2022   CREATININE 0.71 10/26/2022   BUN 13 10/26/2022   CO2 28 10/26/2022   TSH 2.57 10/26/2022    DG Chest 1 View  Result Date: 06/28/2022 CLINICAL DATA:  Positive PPD. EXAM: CHEST  1 VIEW COMPARISON:  Limited correlation made with thoracic spine radiographs 06/16/2022. FINDINGS: The heart size and mediastinal contours are stable with diffuse aortic tortuosity and a moderate size hiatal hernia. There is mild linear atelectasis or scarring at both lung bases. No confluent airspace opacity, pleural effusion or pneumothorax. Old rib fractures are noted on the left. The thoracic compression deformities seen on prior radiographs are not well seen on this single PA view. IMPRESSION: No radiographic evidence of tuberculosis or active cardiopulmonary process. Electronically Signed   By: Carey Bullocks M.D.   On: 06/28/2022 11:43    Assessment & Plan:   Problem List Items Addressed This Visit     Tremor of right hand     F/u w/Dr Tat On Primidone      Essential tremor     F/u w/Dr Tat On Primidone      Vitamin D deficiency    Continue with vitamin D 29528 biweekly      Insomnia    We will use a low-dose Valium.  Hopefully it will help her tremors as well  Potential benefits of a long term benzodiazepines  use as well as potential risks  and complications were explained to the patient and were aknowledged.      Other Visit Diagnoses     Chronic pain of left knee    -  Primary   Relevant Medications   meloxicam (MOBIC) 7.5 MG tablet   Other Relevant Orders   DG Knee 1-2 Views Left         Meds ordered this encounter  Medications   meloxicam (MOBIC) 7.5 MG tablet    Sig: Take 1 tablet (7.5 mg total) by mouth at bedtime. With food    Dispense:  30 tablet    Refill:  5   Vitamin D, Ergocalciferol, (DRISDOL) 1.25 MG (50000 UNIT) CAPS capsule    Sig: Take 1  capsule (50,000 Units total) by mouth every 14 (fourteen) days.    Dispense:  6 capsule    Refill:  3      Follow-up: Return in about 3 months (around 05/13/2023) for a follow-up visit.  Sonda Primes, MD

## 2023-02-10 NOTE — Addendum Note (Signed)
Addended by: Delsa Grana R on: 02/10/2023 11:02 AM   Modules accepted: Orders

## 2023-02-11 DIAGNOSIS — Z87820 Personal history of traumatic brain injury: Secondary | ICD-10-CM | POA: Diagnosis not present

## 2023-02-12 DIAGNOSIS — Z87820 Personal history of traumatic brain injury: Secondary | ICD-10-CM | POA: Diagnosis not present

## 2023-02-13 DIAGNOSIS — Z87820 Personal history of traumatic brain injury: Secondary | ICD-10-CM | POA: Diagnosis not present

## 2023-02-14 DIAGNOSIS — Z87820 Personal history of traumatic brain injury: Secondary | ICD-10-CM | POA: Diagnosis not present

## 2023-02-15 DIAGNOSIS — Z87820 Personal history of traumatic brain injury: Secondary | ICD-10-CM | POA: Diagnosis not present

## 2023-02-16 DIAGNOSIS — Z87820 Personal history of traumatic brain injury: Secondary | ICD-10-CM | POA: Diagnosis not present

## 2023-02-17 DIAGNOSIS — Z87820 Personal history of traumatic brain injury: Secondary | ICD-10-CM | POA: Diagnosis not present

## 2023-02-18 DIAGNOSIS — Z87820 Personal history of traumatic brain injury: Secondary | ICD-10-CM | POA: Diagnosis not present

## 2023-02-19 DIAGNOSIS — Z87820 Personal history of traumatic brain injury: Secondary | ICD-10-CM | POA: Diagnosis not present

## 2023-02-20 DIAGNOSIS — Z87820 Personal history of traumatic brain injury: Secondary | ICD-10-CM | POA: Diagnosis not present

## 2023-02-21 DIAGNOSIS — Z87820 Personal history of traumatic brain injury: Secondary | ICD-10-CM | POA: Diagnosis not present

## 2023-02-22 DIAGNOSIS — Z87820 Personal history of traumatic brain injury: Secondary | ICD-10-CM | POA: Diagnosis not present

## 2023-02-23 DIAGNOSIS — Z87820 Personal history of traumatic brain injury: Secondary | ICD-10-CM | POA: Diagnosis not present

## 2023-02-24 DIAGNOSIS — Z87820 Personal history of traumatic brain injury: Secondary | ICD-10-CM | POA: Diagnosis not present

## 2023-02-25 DIAGNOSIS — Z87820 Personal history of traumatic brain injury: Secondary | ICD-10-CM | POA: Diagnosis not present

## 2023-02-26 DIAGNOSIS — Z87820 Personal history of traumatic brain injury: Secondary | ICD-10-CM | POA: Diagnosis not present

## 2023-02-27 DIAGNOSIS — Z87820 Personal history of traumatic brain injury: Secondary | ICD-10-CM | POA: Diagnosis not present

## 2023-02-28 DIAGNOSIS — Z87820 Personal history of traumatic brain injury: Secondary | ICD-10-CM | POA: Diagnosis not present

## 2023-03-08 DIAGNOSIS — Z87820 Personal history of traumatic brain injury: Secondary | ICD-10-CM | POA: Diagnosis not present
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
## 2023-03-09 DIAGNOSIS — Z87820 Personal history of traumatic brain injury: Secondary | ICD-10-CM | POA: Diagnosis not present

## 2023-03-10 ENCOUNTER — Ambulatory Visit (INDEPENDENT_AMBULATORY_CARE_PROVIDER_SITE_OTHER): Payer: Medicaid Other | Admitting: Neurology

## 2023-03-10 ENCOUNTER — Encounter: Payer: Self-pay | Admitting: Neurology

## 2023-03-10 VITALS — BP 116/80 | HR 73 | Wt 120.6 lb

## 2023-03-10 DIAGNOSIS — F330 Major depressive disorder, recurrent, mild: Secondary | ICD-10-CM

## 2023-03-10 DIAGNOSIS — Z87820 Personal history of traumatic brain injury: Secondary | ICD-10-CM | POA: Diagnosis not present

## 2023-03-10 DIAGNOSIS — G250 Essential tremor: Secondary | ICD-10-CM

## 2023-03-10 DIAGNOSIS — G243 Spasmodic torticollis: Secondary | ICD-10-CM

## 2023-03-10 MED ORDER — PRIMIDONE 50 MG PO TABS: 100.0000 mg | ORAL_TABLET | Freq: Every day | ORAL | 3 refills | Status: AC

## 2023-03-10 NOTE — Patient Instructions (Signed)

## 2023-03-11 DIAGNOSIS — Z87820 Personal history of traumatic brain injury: Secondary | ICD-10-CM | POA: Diagnosis not present

## 2023-03-12 DIAGNOSIS — Z87820 Personal history of traumatic brain injury: Secondary | ICD-10-CM | POA: Diagnosis not present

## 2023-03-13 DIAGNOSIS — Z87820 Personal history of traumatic brain injury: Secondary | ICD-10-CM | POA: Diagnosis not present

## 2023-03-14 DIAGNOSIS — Z87820 Personal history of traumatic brain injury: Secondary | ICD-10-CM | POA: Diagnosis not present

## 2023-03-15 DIAGNOSIS — Z87820 Personal history of traumatic brain injury: Secondary | ICD-10-CM | POA: Diagnosis not present

## 2023-03-16 DIAGNOSIS — Z87820 Personal history of traumatic brain injury: Secondary | ICD-10-CM | POA: Diagnosis not present

## 2023-03-17 DIAGNOSIS — Z87820 Personal history of traumatic brain injury: Secondary | ICD-10-CM | POA: Diagnosis not present

## 2023-03-18 DIAGNOSIS — Z87820 Personal history of traumatic brain injury: Secondary | ICD-10-CM | POA: Diagnosis not present

## 2023-03-19 DIAGNOSIS — Z87820 Personal history of traumatic brain injury: Secondary | ICD-10-CM | POA: Diagnosis not present

## 2023-03-20 DIAGNOSIS — Z87820 Personal history of traumatic brain injury: Secondary | ICD-10-CM | POA: Diagnosis not present

## 2023-03-21 ENCOUNTER — Other Ambulatory Visit: Payer: Self-pay | Admitting: Internal Medicine

## 2023-03-21 DIAGNOSIS — Z87820 Personal history of traumatic brain injury: Secondary | ICD-10-CM | POA: Diagnosis not present

## 2023-03-22 DIAGNOSIS — Z87820 Personal history of traumatic brain injury: Secondary | ICD-10-CM | POA: Diagnosis not present

## 2023-03-23 DIAGNOSIS — Z87820 Personal history of traumatic brain injury: Secondary | ICD-10-CM | POA: Diagnosis not present

## 2023-03-24 DIAGNOSIS — Z87820 Personal history of traumatic brain injury: Secondary | ICD-10-CM | POA: Diagnosis not present

## 2023-03-25 DIAGNOSIS — Z87820 Personal history of traumatic brain injury: Secondary | ICD-10-CM | POA: Diagnosis not present

## 2023-03-26 DIAGNOSIS — Z87820 Personal history of traumatic brain injury: Secondary | ICD-10-CM | POA: Diagnosis not present

## 2023-03-27 DIAGNOSIS — Z87820 Personal history of traumatic brain injury: Secondary | ICD-10-CM | POA: Diagnosis not present

## 2023-03-28 DIAGNOSIS — Z87820 Personal history of traumatic brain injury: Secondary | ICD-10-CM | POA: Diagnosis not present

## 2023-03-29 DIAGNOSIS — Z87820 Personal history of traumatic brain injury: Secondary | ICD-10-CM | POA: Diagnosis not present

## 2023-03-30 DIAGNOSIS — Z87820 Personal history of traumatic brain injury: Secondary | ICD-10-CM | POA: Diagnosis not present

## 2023-03-31 DIAGNOSIS — Z87820 Personal history of traumatic brain injury: Secondary | ICD-10-CM | POA: Diagnosis not present

## 2023-04-01 DIAGNOSIS — Z87820 Personal history of traumatic brain injury: Secondary | ICD-10-CM | POA: Diagnosis not present

## 2023-04-02 DIAGNOSIS — Z87820 Personal history of traumatic brain injury: Secondary | ICD-10-CM | POA: Diagnosis not present

## 2023-04-03 DIAGNOSIS — Z87820 Personal history of traumatic brain injury: Secondary | ICD-10-CM | POA: Diagnosis not present

## 2023-04-04 DIAGNOSIS — Z87820 Personal history of traumatic brain injury: Secondary | ICD-10-CM | POA: Diagnosis not present

## 2023-04-05 DIAGNOSIS — Z87820 Personal history of traumatic brain injury: Secondary | ICD-10-CM | POA: Diagnosis not present

## 2023-04-06 DIAGNOSIS — Z87820 Personal history of traumatic brain injury: Secondary | ICD-10-CM | POA: Diagnosis not present

## 2023-04-07 DIAGNOSIS — Z87820 Personal history of traumatic brain injury: Secondary | ICD-10-CM | POA: Diagnosis not present

## 2023-04-08 DIAGNOSIS — Z87820 Personal history of traumatic brain injury: Secondary | ICD-10-CM | POA: Diagnosis not present

## 2023-04-09 DIAGNOSIS — Z87820 Personal history of traumatic brain injury: Secondary | ICD-10-CM | POA: Diagnosis not present

## 2023-04-10 DIAGNOSIS — Z87820 Personal history of traumatic brain injury: Secondary | ICD-10-CM | POA: Diagnosis not present

## 2023-04-11 DIAGNOSIS — Z87820 Personal history of traumatic brain injury: Secondary | ICD-10-CM | POA: Diagnosis not present

## 2023-04-12 DIAGNOSIS — Z87820 Personal history of traumatic brain injury: Secondary | ICD-10-CM | POA: Diagnosis not present

## 2023-04-13 DIAGNOSIS — Z87820 Personal history of traumatic brain injury: Secondary | ICD-10-CM | POA: Diagnosis not present

## 2023-04-14 DIAGNOSIS — Z87820 Personal history of traumatic brain injury: Secondary | ICD-10-CM | POA: Diagnosis not present

## 2023-04-15 DIAGNOSIS — Z87820 Personal history of traumatic brain injury: Secondary | ICD-10-CM | POA: Diagnosis not present

## 2023-04-16 DIAGNOSIS — Z87820 Personal history of traumatic brain injury: Secondary | ICD-10-CM | POA: Diagnosis not present

## 2023-04-17 DIAGNOSIS — Z87820 Personal history of traumatic brain injury: Secondary | ICD-10-CM | POA: Diagnosis not present

## 2023-04-18 DIAGNOSIS — Z87820 Personal history of traumatic brain injury: Secondary | ICD-10-CM | POA: Diagnosis not present

## 2023-04-19 DIAGNOSIS — Z87820 Personal history of traumatic brain injury: Secondary | ICD-10-CM | POA: Diagnosis not present

## 2023-04-20 ENCOUNTER — Emergency Department (HOSPITAL_COMMUNITY)
Admission: EM | Admit: 2023-04-20 | Discharge: 2023-04-20 | Discharge status: Against Medical Advice | Payer: Medicaid Other | Attending: Emergency Medicine | Admitting: Emergency Medicine

## 2023-04-20 ENCOUNTER — Other Ambulatory Visit: Payer: Self-pay

## 2023-04-20 DIAGNOSIS — R519 Headache, unspecified: Secondary | ICD-10-CM | POA: Insufficient documentation

## 2023-04-20 DIAGNOSIS — R030 Elevated blood-pressure reading, without diagnosis of hypertension: Secondary | ICD-10-CM | POA: Diagnosis not present

## 2023-04-20 DIAGNOSIS — R42 Dizziness and giddiness: Secondary | ICD-10-CM | POA: Diagnosis not present

## 2023-04-20 DIAGNOSIS — Z5321 Procedure and treatment not carried out due to patient leaving prior to being seen by health care provider: Secondary | ICD-10-CM | POA: Insufficient documentation

## 2023-04-20 DIAGNOSIS — Z87820 Personal history of traumatic brain injury: Secondary | ICD-10-CM | POA: Diagnosis not present

## 2023-04-20 LAB — CBC
HCT: 40.9 % (ref 36.0–46.0)
Hemoglobin: 12.8 g/dL (ref 12.0–15.0)
MCH: 24.4 pg — ABNORMAL LOW (ref 26.0–34.0)
MCHC: 31.3 g/dL (ref 30.0–36.0)
MCV: 78.1 fL — ABNORMAL LOW (ref 80.0–100.0)
Platelets: 163 10*3/uL (ref 150–400)
RBC: 5.24 MIL/uL — ABNORMAL HIGH (ref 3.87–5.11)
RDW: 16.7 % — ABNORMAL HIGH (ref 11.5–15.5)
WBC: 5.2 10*3/uL (ref 4.0–10.5)
nRBC: 0 % (ref 0.0–0.2)

## 2023-04-20 LAB — URINALYSIS, ROUTINE W REFLEX MICROSCOPIC
Bilirubin Urine: NEGATIVE
Glucose, UA: NEGATIVE mg/dL
Ketones, ur: NEGATIVE mg/dL
Nitrite: POSITIVE — AB
Protein, ur: 30 mg/dL — AB
Specific Gravity, Urine: 1.014 (ref 1.005–1.030)
WBC, UA: >50 WBC/hpf (ref 0–5)
pH: 5 (ref 5.0–8.0)

## 2023-04-20 LAB — BASIC METABOLIC PANEL
Anion gap: 9 (ref 5–15)
BUN: 16 mg/dL (ref 8–23)
CO2: 23 mmol/L (ref 22–32)
Calcium: 8.8 mg/dL — ABNORMAL LOW (ref 8.9–10.3)
Chloride: 108 mmol/L (ref 98–111)
Creatinine, Ser: 0.7 mg/dL (ref 0.44–1.00)
GFR, Estimated: >60 mL/min (ref >60)
Glucose, Bld: 85 mg/dL (ref 70–99)
Potassium: 4.2 mmol/L (ref 3.5–5.1)
Sodium: 140 mmol/L (ref 135–145)

## 2023-04-20 NOTE — ED Triage Notes (Signed)
Pt's son reports pt's bp was 175/113 last night and this morning 175/107, 170/100 at noon, and 164/96 at 1500. Pt has no hx of hypertension. Endorses headache and dizziness.

## 2023-04-20 NOTE — ED Notes (Signed)
Pt left without being seen.

## 2023-04-21 ENCOUNTER — Encounter: Payer: Self-pay | Admitting: Internal Medicine

## 2023-04-21 DIAGNOSIS — Z87820 Personal history of traumatic brain injury: Secondary | ICD-10-CM | POA: Diagnosis not present

## 2023-04-22 ENCOUNTER — Encounter: Payer: Self-pay | Admitting: Internal Medicine

## 2023-04-22 ENCOUNTER — Ambulatory Visit (INDEPENDENT_AMBULATORY_CARE_PROVIDER_SITE_OTHER): Payer: Medicaid Other | Admitting: Internal Medicine

## 2023-04-22 VITALS — BP 110/70 | HR 72 | Temp 98.1°F | Ht 60.0 in | Wt 123.0 lb

## 2023-04-22 DIAGNOSIS — N12 Tubulo-interstitial nephritis, not specified as acute or chronic: Secondary | ICD-10-CM | POA: Diagnosis not present

## 2023-04-22 DIAGNOSIS — Z87820 Personal history of traumatic brain injury: Secondary | ICD-10-CM | POA: Diagnosis not present

## 2023-04-22 DIAGNOSIS — R251 Tremor, unspecified: Secondary | ICD-10-CM | POA: Diagnosis not present

## 2023-04-22 DIAGNOSIS — R030 Elevated blood-pressure reading, without diagnosis of hypertension: Secondary | ICD-10-CM | POA: Diagnosis not present

## 2023-04-22 MED ORDER — CEFUROXIME AXETIL 250 MG PO TABS: 250.0000 mg | ORAL_TABLET | Freq: Two times a day (BID) | ORAL | 0 refills | Status: AC

## 2023-04-22 MED ORDER — CEFTRIAXONE SODIUM 500 MG IJ SOLR
500.0000 mg | Freq: Once | INTRAMUSCULAR | Status: AC
Start: 2023-04-22 — End: 2023-04-22
  Administered 2023-04-22: 500 mg via INTRAMUSCULAR

## 2023-04-22 MED ORDER — ONDANSETRON HCL 4 MG PO TABS: 4.0000 mg | ORAL_TABLET | Freq: Three times a day (TID) | ORAL | 0 refills | Status: AC | PRN

## 2023-04-22 MED ORDER — ONDANSETRON HCL 4 MG PO TABS
4.0000 mg | ORAL_TABLET | Freq: Once | ORAL | Status: AC
Start: 2023-04-22 — End: 2023-04-22
  Administered 2023-04-22: 4 mg via ORAL

## 2023-04-22 NOTE — Progress Notes (Signed)
Subjective:  Patient ID: Crystal Rodgers, female    DOB: 1932/09/08  Age: 89 y.o. MRN: [id number]  CC: Hospitalization Follow-up   HPI Crystal Rodgers presents for not feeling well while walking Weak since 04/19/23.  They went to ER on Tue - weakness, nausea, HA She is here with her son.  They are concerned about her blood pressure elevation  Outpatient Medications Prior to Visit  Medication Sig Dispense Refill   AMBULATORY NON FORMULARY MEDICATION Walker; 2 wheels, 2 tennis balls Dx: gait instability R26.81 1 Device 0   aspirin EC 81 MG tablet Take 81 mg by mouth daily. Swallow whole.     Cholecalciferol (VITAMIN D3) 50 MCG (2000 UT) capsule Take 1 capsule (2,000 Units total) by mouth daily. 100 capsule 3   fluticasone (FLONASE) 50 MCG/ACT nasal spray SPRAY 2 SPRAYS INTO EACH NOSTRIL EVERY DAY 48 mL 3   ketotifen (ZADITOR) 0.035 % ophthalmic solution Place 2 drops into both eyes in the morning and at bedtime. 10 mL 3   loratadine (CLARITIN) 10 MG tablet Take 1 tablet (10 mg total) by mouth daily. 30 tablet 11   meloxicam (MOBIC) 7.5 MG tablet Take 1 tablet (7.5 mg total) by mouth at bedtime. With food 30 tablet 5   primidone (MYSOLINE) 50 MG tablet Take 2 tablets (100 mg total) by mouth at bedtime. 180 tablet 3   triamcinolone cream (KENALOG) 0.5 % Apply 1 Application topically 3 (three) times daily. 120 g 1   Vitamin D, Ergocalciferol, (DRISDOL) 1.25 MG (50000 UNIT) CAPS capsule Take 1 capsule (50,000 Units total) by mouth every 14 (fourteen) days. 6 capsule 3   diazepam (VALIUM) 2 MG tablet Take 1 tablet (2 mg total) by mouth at bedtime. 90 tablet 1   B Complex-Folic Acid (B COMPLEX PLUS) TABS Take 1 tablet by mouth daily. (Patient not taking: Reported on 06/16/2022) 100 tablet 3   No facility-administered medications prior to visit.    ROS: Review of Systems  Constitutional:  Positive for fatigue. Negative for activity change, appetite change, chills and unexpected weight  change.  HENT:  Negative for congestion, mouth sores and sinus pressure.   Eyes:  Negative for visual disturbance.  Respiratory:  Negative for cough and chest tightness.   Gastrointestinal:  Negative for abdominal pain and nausea.  Genitourinary:  Positive for frequency. Negative for difficulty urinating and vaginal pain.  Musculoskeletal:  Negative for back pain and gait problem.  Skin:  Negative for pallor and rash.  Neurological:  Positive for headaches. Negative for dizziness, tremors, weakness and numbness.  Psychiatric/Behavioral:  Negative for confusion and sleep disturbance.     Objective:  BP 110/70 (BP Location: Left Arm, Patient Position: Sitting, Cuff Size: Normal)   Pulse 72   Temp 98.1 F (36.7 C) (Oral)   Ht 5' (1.524 m)   Wt 123 lb (55.8 kg)   LMP  (LMP Unknown)   SpO2 93%   BMI 24.02 kg/m   BP Readings from Last 3 Encounters:  04/22/23 110/70  04/20/23 (!) 149/96  03/10/23 116/80    Wt Readings from Last 3 Encounters:  04/22/23 123 lb (55.8 kg)  03/10/23 120 lb 9.6 oz (54.7 kg)  02/10/23 119 lb (54 kg)    Physical Exam Constitutional:      General: She is not in acute distress.    Appearance: She is well-developed. She is obese.  HENT:     Head: Normocephalic.     Right Ear: External ear normal.  Left Ear: External ear normal.     Nose: Nose normal.  Eyes:     General:        Right eye: No discharge.        Left eye: No discharge.     Conjunctiva/sclera: Conjunctivae normal.     Pupils: Pupils are equal, round, and reactive to light.  Neck:     Thyroid: No thyromegaly.     Vascular: No JVD.     Trachea: No tracheal deviation.  Cardiovascular:     Rate and Rhythm: Normal rate and regular rhythm.     Heart sounds: Normal heart sounds.  Pulmonary:     Effort: No respiratory distress.     Breath sounds: No stridor. No wheezing.  Abdominal:     General: Bowel sounds are normal. There is no distension.     Palpations: Abdomen is soft. There  is no mass.     Tenderness: There is no abdominal tenderness. There is no guarding or rebound.  Musculoskeletal:        General: No tenderness.     Cervical back: Normal range of motion and neck supple. No rigidity.  Lymphadenopathy:     Cervical: No cervical adenopathy.  Skin:    Findings: No erythema or rash.  Neurological:     Cranial Nerves: No cranial nerve deficit.     Motor: No abnormal muscle tone.     Coordination: Coordination normal.     Deep Tendon Reflexes: Reflexes normal.  Psychiatric:        Behavior: Behavior normal.        Thought Content: Thought content normal.        Judgment: Judgment normal.    The patient is appears tired  Lab Results  Component Value Date   WBC 5.2 04/20/2023   HGB 12.8 04/20/2023   HCT 40.9 04/20/2023   PLT 163 04/20/2023   GLUCOSE 85 04/20/2023   CHOL 184 10/29/2021   TRIG 204.0 (H) 10/29/2021   HDL 56.30 10/29/2021   LDLDIRECT 115.0 10/29/2021   ALT 11 10/26/2022   AST 18 10/26/2022   NA 140 04/20/2023   K 4.2 04/20/2023   CL 108 04/20/2023   CREATININE 0.70 04/20/2023   BUN 16 04/20/2023   CO2 23 04/20/2023   TSH 2.57 10/26/2022    No results found.  Assessment & Plan:   Problem List Items Addressed This Visit     Tremor of right hand     F/u w/Dr Tat On Primidone      Pyelonephritis - Primary    New.  I think pyelonephritis is the most likely diagnosis/cause of her fever/malaise.  She was given - Rocepin 500 mg IM Ceftin 250 mg bid Rx to start tomorrow      Elevated blood pressure reading    Continue to monitor blood pressure BP Readings from Last 3 Encounters:  04/22/23 110/70  04/20/23 (!) 149/96  03/10/23 116/80            Meds ordered this encounter  Medications   cefUROXime (CEFTIN) 250 MG tablet    Sig: Take 1 tablet (250 mg total) by mouth 2 (two) times daily with a meal for 10 days.    Dispense:  20 tablet    Refill:  0   ondansetron (ZOFRAN) 4 MG tablet    Sig: Take 1 tablet (4 mg  total) by mouth every 8 (eight) hours as needed for nausea or vomiting.    Dispense:  20  tablet    Refill:  0   cefTRIAXone (ROCEPHIN) injection 500 mg   ondansetron (ZOFRAN) tablet 4 mg      Follow-up: Return in about 2 weeks (around 05/06/2023) for a follow-up visit.  Sonda Primes, MD

## 2023-04-22 NOTE — Assessment & Plan Note (Addendum)
New.  I think pyelonephritis is the most likely diagnosis/cause of her fever/malaise.  She was given - Rocepin 500 mg IM Ceftin 250 mg bid Rx to start tomorrow

## 2023-04-23 DIAGNOSIS — Z87820 Personal history of traumatic brain injury: Secondary | ICD-10-CM | POA: Diagnosis not present

## 2023-04-24 DIAGNOSIS — Z87820 Personal history of traumatic brain injury: Secondary | ICD-10-CM | POA: Diagnosis not present

## 2023-04-25 DIAGNOSIS — Z87820 Personal history of traumatic brain injury: Secondary | ICD-10-CM | POA: Diagnosis not present

## 2023-04-26 DIAGNOSIS — Z87820 Personal history of traumatic brain injury: Secondary | ICD-10-CM | POA: Diagnosis not present

## 2023-04-27 DIAGNOSIS — Z87820 Personal history of traumatic brain injury: Secondary | ICD-10-CM | POA: Diagnosis not present

## 2023-04-28 DIAGNOSIS — Z87820 Personal history of traumatic brain injury: Secondary | ICD-10-CM | POA: Diagnosis not present

## 2023-04-29 DIAGNOSIS — Z87820 Personal history of traumatic brain injury: Secondary | ICD-10-CM | POA: Diagnosis not present

## 2023-04-30 DIAGNOSIS — Z87820 Personal history of traumatic brain injury: Secondary | ICD-10-CM | POA: Diagnosis not present

## 2023-05-01 DIAGNOSIS — Z87820 Personal history of traumatic brain injury: Secondary | ICD-10-CM | POA: Diagnosis not present

## 2023-05-02 DIAGNOSIS — Z87820 Personal history of traumatic brain injury: Secondary | ICD-10-CM | POA: Diagnosis not present

## 2023-05-03 DIAGNOSIS — Z87820 Personal history of traumatic brain injury: Secondary | ICD-10-CM | POA: Diagnosis not present

## 2023-05-04 DIAGNOSIS — Z87820 Personal history of traumatic brain injury: Secondary | ICD-10-CM | POA: Diagnosis not present

## 2023-05-05 DIAGNOSIS — Z87820 Personal history of traumatic brain injury: Secondary | ICD-10-CM | POA: Diagnosis not present

## 2023-05-06 DIAGNOSIS — Z87820 Personal history of traumatic brain injury: Secondary | ICD-10-CM | POA: Diagnosis not present

## 2023-05-07 ENCOUNTER — Other Ambulatory Visit: Payer: Self-pay | Admitting: Internal Medicine

## 2023-05-07 DIAGNOSIS — Z87820 Personal history of traumatic brain injury: Secondary | ICD-10-CM | POA: Diagnosis not present

## 2023-05-08 DIAGNOSIS — Z87820 Personal history of traumatic brain injury: Secondary | ICD-10-CM | POA: Diagnosis not present

## 2023-05-09 DIAGNOSIS — Z87820 Personal history of traumatic brain injury: Secondary | ICD-10-CM | POA: Diagnosis not present

## 2023-05-10 DIAGNOSIS — Z87820 Personal history of traumatic brain injury: Secondary | ICD-10-CM | POA: Diagnosis not present

## 2023-05-11 DIAGNOSIS — Z87820 Personal history of traumatic brain injury: Secondary | ICD-10-CM | POA: Diagnosis not present

## 2023-05-12 DIAGNOSIS — Z87820 Personal history of traumatic brain injury: Secondary | ICD-10-CM | POA: Diagnosis not present

## 2023-05-12 DIAGNOSIS — R030 Elevated blood-pressure reading, without diagnosis of hypertension: Secondary | ICD-10-CM | POA: Insufficient documentation

## 2023-05-12 NOTE — Assessment & Plan Note (Signed)
Continue to monitor blood pressure BP Readings from Last 3 Encounters:  04/22/23 110/70  04/20/23 (!) 149/96  03/10/23 116/80

## 2023-05-12 NOTE — Assessment & Plan Note (Signed)
F/u w/Dr Tat On Primidone

## 2023-05-13 ENCOUNTER — Encounter: Payer: Self-pay | Admitting: Internal Medicine

## 2023-05-13 ENCOUNTER — Ambulatory Visit: Payer: Medicaid Other | Admitting: Internal Medicine

## 2023-05-13 VITALS — BP 120/70 | HR 76 | Temp 98.1°F | Ht 60.0 in | Wt 123.0 lb

## 2023-05-13 DIAGNOSIS — G250 Essential tremor: Secondary | ICD-10-CM

## 2023-05-13 DIAGNOSIS — G4700 Insomnia, unspecified: Secondary | ICD-10-CM | POA: Diagnosis not present

## 2023-05-13 DIAGNOSIS — M7632 Iliotibial band syndrome, left leg: Secondary | ICD-10-CM | POA: Diagnosis not present

## 2023-05-13 DIAGNOSIS — Z87820 Personal history of traumatic brain injury: Secondary | ICD-10-CM | POA: Diagnosis not present

## 2023-05-13 DIAGNOSIS — M7060 Trochanteric bursitis, unspecified hip: Secondary | ICD-10-CM | POA: Insufficient documentation

## 2023-05-13 DIAGNOSIS — N12 Tubulo-interstitial nephritis, not specified as acute or chronic: Secondary | ICD-10-CM | POA: Diagnosis not present

## 2023-05-13 DIAGNOSIS — M7062 Trochanteric bursitis, left hip: Secondary | ICD-10-CM | POA: Diagnosis not present

## 2023-05-13 MED ORDER — METHYLPREDNISOLONE ACETATE 80 MG/ML IJ SUSP
80.0000 mg | Freq: Once | INTRAMUSCULAR | Status: AC
Start: 2023-05-13 — End: 2023-05-13
  Administered 2023-05-13: 80 mg via INTRAMUSCULAR

## 2023-05-13 NOTE — Assessment & Plan Note (Signed)
Better on diazepam.  Continue with diazepam

## 2023-05-13 NOTE — Progress Notes (Signed)
Subjective:  Patient ID: Crystal Rodgers, female    DOB: 1932/07/15  Age: 89 y.o. MRN: [id number]  CC: Medical Management of Chronic Issues (3 mnth f/u, continous pain in back and legs causing pt not to be able to sleep.)   HPI Crystal Rodgers presents for L hip and L upper lateral leg pain that has been getting worse lately.  It is worse with walking.  In fact, the knee is not really bothering her now.  The pain is located more in the lateral knee and distal lateral thigh. She is here with her son Follow-up on UTI and insomnia  Outpatient Medications Prior to Visit  Medication Sig Dispense Refill   AMBULATORY NON FORMULARY MEDICATION Walker; 2 wheels, 2 tennis balls Dx: gait instability R26.81 1 Device 0   aspirin EC 81 MG tablet Take 81 mg by mouth daily. Swallow whole.     Cholecalciferol (VITAMIN D3) 50 MCG (2000 UT) capsule Take 1 capsule (2,000 Units total) by mouth daily. 100 capsule 3   diazepam (VALIUM) 2 MG tablet TAKE 1 TABLET BY MOUTH AT BEDTIME. 30 tablet 5   fluticasone (FLONASE) 50 MCG/ACT nasal spray SPRAY 2 SPRAYS INTO EACH NOSTRIL EVERY DAY 48 mL 3   ketotifen (ZADITOR) 0.035 % ophthalmic solution Place 2 drops into both eyes in the morning and at bedtime. 10 mL 3   loratadine (CLARITIN) 10 MG tablet Take 1 tablet (10 mg total) by mouth daily. 30 tablet 11   meloxicam (MOBIC) 7.5 MG tablet Take 1 tablet (7.5 mg total) by mouth at bedtime. With food 30 tablet 5   ondansetron (ZOFRAN) 4 MG tablet Take 1 tablet (4 mg total) by mouth every 8 (eight) hours as needed for nausea or vomiting. 20 tablet 0   primidone (MYSOLINE) 50 MG tablet Take 2 tablets (100 mg total) by mouth at bedtime. 180 tablet 3   triamcinolone cream (KENALOG) 0.5 % Apply 1 Application topically 3 (three) times daily. 120 g 1   Vitamin D, Ergocalciferol, (DRISDOL) 1.25 MG (50000 UNIT) CAPS capsule Take 1 capsule (50,000 Units total) by mouth every 14 (fourteen) days. 6 capsule 3   B Complex-Folic  Acid (B COMPLEX PLUS) TABS Take 1 tablet by mouth daily. (Patient not taking: Reported on 06/16/2022) 100 tablet 3   No facility-administered medications prior to visit.    ROS: Review of Systems  Constitutional:  Negative for activity change, appetite change, chills, fatigue and unexpected weight change.  HENT:  Negative for congestion, mouth sores and sinus pressure.   Eyes:  Negative for visual disturbance.  Respiratory:  Negative for cough and chest tightness.   Cardiovascular:  Negative for leg swelling.  Gastrointestinal:  Negative for abdominal pain and nausea.  Genitourinary:  Negative for difficulty urinating, frequency and vaginal pain.  Musculoskeletal:  Positive for arthralgias and gait problem. Negative for back pain and neck stiffness.  Skin:  Negative for pallor and rash.  Neurological:  Positive for tremors. Negative for dizziness, weakness, numbness and headaches.  Psychiatric/Behavioral:  Positive for sleep disturbance. Negative for behavioral problems and confusion. The patient is not nervous/anxious.     Objective:  BP 120/70 (BP Location: Left Arm, Patient Position: Sitting, Cuff Size: Normal)   Pulse 76   Temp 98.1 F (36.7 C) (Oral)   Ht 5' (1.524 m)   Wt 123 lb (55.8 kg)   LMP  (LMP Unknown)   SpO2 97%   BMI 24.02 kg/m   BP Readings from Last 3 Encounters:  05/13/23 120/70  04/22/23 110/70  04/20/23 (!) 149/96    Wt Readings from Last 3 Encounters:  05/13/23 123 lb (55.8 kg)  04/22/23 123 lb (55.8 kg)  03/10/23 120 lb 9.6 oz (54.7 kg)    Physical Exam Constitutional:      General: She is not in acute distress.    Appearance: Normal appearance. She is well-developed.  HENT:     Head: Normocephalic.     Right Ear: External ear normal.     Left Ear: External ear normal.     Nose: Nose normal.  Eyes:     General:        Right eye: No discharge.        Left eye: No discharge.     Conjunctiva/sclera: Conjunctivae normal.     Pupils: Pupils  are equal, round, and reactive to light.  Neck:     Thyroid: No thyromegaly.     Vascular: No JVD.     Trachea: No tracheal deviation.  Cardiovascular:     Rate and Rhythm: Normal rate and regular rhythm.     Heart sounds: Normal heart sounds.  Pulmonary:     Effort: No respiratory distress.     Breath sounds: No stridor. No wheezing.  Abdominal:     General: Bowel sounds are normal. There is no distension.     Palpations: Abdomen is soft. There is no mass.     Tenderness: There is no abdominal tenderness. There is no guarding or rebound.  Musculoskeletal:        General: Tenderness present. No signs of injury.     Cervical back: Normal range of motion and neck supple. No rigidity.     Right lower leg: No edema.     Left lower leg: No edema.  Lymphadenopathy:     Cervical: No cervical adenopathy.  Skin:    Findings: No erythema or rash.  Neurological:     Mental Status: She is oriented to person, place, and time.     Cranial Nerves: No cranial nerve deficit.     Motor: No abnormal muscle tone.     Coordination: Coordination normal.     Gait: Gait abnormal.     Deep Tendon Reflexes: Reflexes normal.  Psychiatric:        Behavior: Behavior normal.        Thought Content: Thought content normal.        Judgment: Judgment normal.   The gait is antalgic.  Her left leg is at least half an inch shorter than the right. L IT band is painful to palpation.  Left trochanter major area is tender to palpation. Knee Is nontender.  Knee joint with full range of motion, no pain. LLE is shorter by 1/2 inch   Procedure Note :     Procedure : Joint Injection,  L  hip   Indication:  Trochanteric bursitis with refractory  chronic pain.   Risks including unsuccessful procedure , bleeding, infection, bruising, skin atrophy, "steroid flare-up" and others were explained to the patient in detail as well as the benefits. Informed consent was obtained verbally.  Tthe patient was placed in a  comfortable lateral decubitus position. The point of maximal tenderness was identified. Skin was prepped with Betadine and alcohol. Then, a 5 cc syringe with a 2 inch long 24-gauge needle was used for a bursa injection.. The needle was advanced  Into the bursa. I injected the bursa with 4 mL of 2% lidocaine and 40 mg  of Depo-Medrol .  Band-Aid was applied.   Tolerated well. Complications: None. Good pain relief following the procedure.   Postprocedure instructions :    A Band-Aid should be left on for 12 hours. Injection therapy is not a cure itself. It is used in conjunction with other modalities. You can use nonsteroidal anti-inflammatories like ibuprofen , hot and cold compresses. Rest is recommended in the next 24 hours. You need to report immediately  if fever, chills or any signs of infection develop.  Lab Results  Component Value Date   WBC 5.2 04/20/2023   HGB 12.8 04/20/2023   HCT 40.9 04/20/2023   PLT 163 04/20/2023   GLUCOSE 85 04/20/2023   CHOL 184 10/29/2021   TRIG 204.0 (H) 10/29/2021   HDL 56.30 10/29/2021   LDLDIRECT 115.0 10/29/2021   ALT 11 10/26/2022   AST 18 10/26/2022   NA 140 04/20/2023   K 4.2 04/20/2023   CL 108 04/20/2023   CREATININE 0.70 04/20/2023   BUN 16 04/20/2023   CO2 23 04/20/2023   TSH 2.57 10/26/2022    No results found.  Assessment & Plan:   Problem List Items Addressed This Visit     Essential tremor    Unchanged.  Continue with diazepam      Insomnia    Better on diazepam.  Continue with diazepam      Pyelonephritis    Symptoms resolved      Trochanteric bursitis - Primary    L hip trochanteric bursitis/IT band syndrome.  No symptoms of sciatica.  Showed left leg Start using L leg shoe lift 1/4 or 1/2 inch Chiropractic referral to Dr. Donnie Coffin Continue with meloxicam Blue-Emu cream was recommended to use 2-3 times a day See procedure note       Relevant Orders   Ambulatory referral to Chiropractic   It band syndrome, left     L hip trochanteric bursitis/IT band syndrome.  No symptoms of sciatica.  Showed left leg Start using L leg shoe lift 1/4 or 1/2 inch Chiropractic referral to Dr. Donnie Coffin Continue with meloxicam Blue-Emu cream was recommended to use 2-3 times a day See procedure note Pain is a little better after the injection      Relevant Orders   Ambulatory referral to Chiropractic      Meds ordered this encounter  Medications   methylPREDNISolone acetate (DEPO-MEDROL) injection 80 mg      Follow-up: Return in about 6 weeks (around 06/24/2023) for a follow-up visit.  Sonda Primes, MD

## 2023-05-13 NOTE — Assessment & Plan Note (Signed)
L hip trochanteric bursitis/IT band syndrome.  No symptoms of sciatica.  Showed left leg Start using L leg shoe lift 1/4 or 1/2 inch Chiropractic referral to Dr. Donnie Coffin Continue with meloxicam Blue-Emu cream was recommended to use 2-3 times a day See procedure note Pain is a little better after the injection

## 2023-05-13 NOTE — Patient Instructions (Signed)
L leg lift 1/4 or 1/2 inch

## 2023-05-13 NOTE — Assessment & Plan Note (Signed)
Unchanged.  Continue with diazepam

## 2023-05-13 NOTE — Assessment & Plan Note (Addendum)
L hip trochanteric bursitis/IT band syndrome.  No symptoms of sciatica.  Showed left leg Start using L leg shoe lift 1/4 or 1/2 inch Chiropractic referral to Dr. Donnie Coffin Continue with meloxicam Blue-Emu cream was recommended to use 2-3 times a day See procedure note

## 2023-05-13 NOTE — Assessment & Plan Note (Signed)
Symptoms resolved 

## 2023-05-14 DIAGNOSIS — Z87820 Personal history of traumatic brain injury: Secondary | ICD-10-CM | POA: Diagnosis not present

## 2023-05-15 DIAGNOSIS — Z87820 Personal history of traumatic brain injury: Secondary | ICD-10-CM | POA: Diagnosis not present

## 2023-05-16 DIAGNOSIS — Z87820 Personal history of traumatic brain injury: Secondary | ICD-10-CM | POA: Diagnosis not present

## 2023-05-17 DIAGNOSIS — Z87820 Personal history of traumatic brain injury: Secondary | ICD-10-CM | POA: Diagnosis not present

## 2023-05-18 DIAGNOSIS — Z87820 Personal history of traumatic brain injury: Secondary | ICD-10-CM | POA: Diagnosis not present

## 2023-05-19 DIAGNOSIS — Z87820 Personal history of traumatic brain injury: Secondary | ICD-10-CM | POA: Diagnosis not present

## 2023-05-20 DIAGNOSIS — Z87820 Personal history of traumatic brain injury: Secondary | ICD-10-CM | POA: Diagnosis not present

## 2023-05-21 DIAGNOSIS — Z87820 Personal history of traumatic brain injury: Secondary | ICD-10-CM | POA: Diagnosis not present

## 2023-05-22 DIAGNOSIS — Z87820 Personal history of traumatic brain injury: Secondary | ICD-10-CM | POA: Diagnosis not present

## 2023-05-23 DIAGNOSIS — Z87820 Personal history of traumatic brain injury: Secondary | ICD-10-CM | POA: Diagnosis not present

## 2023-05-24 DIAGNOSIS — Z87820 Personal history of traumatic brain injury: Secondary | ICD-10-CM | POA: Diagnosis not present

## 2023-05-25 DIAGNOSIS — Z87820 Personal history of traumatic brain injury: Secondary | ICD-10-CM | POA: Diagnosis not present

## 2023-05-26 DIAGNOSIS — Z87820 Personal history of traumatic brain injury: Secondary | ICD-10-CM | POA: Diagnosis not present

## 2023-05-27 DIAGNOSIS — Z87820 Personal history of traumatic brain injury: Secondary | ICD-10-CM | POA: Diagnosis not present

## 2023-05-28 DIAGNOSIS — Z87820 Personal history of traumatic brain injury: Secondary | ICD-10-CM | POA: Diagnosis not present

## 2023-05-29 DIAGNOSIS — Z87820 Personal history of traumatic brain injury: Secondary | ICD-10-CM | POA: Diagnosis not present

## 2023-05-30 DIAGNOSIS — Z87820 Personal history of traumatic brain injury: Secondary | ICD-10-CM | POA: Diagnosis not present

## 2023-05-31 DIAGNOSIS — Z87820 Personal history of traumatic brain injury: Secondary | ICD-10-CM | POA: Diagnosis not present

## 2023-06-01 DIAGNOSIS — Z87820 Personal history of traumatic brain injury: Secondary | ICD-10-CM | POA: Diagnosis not present

## 2023-06-02 DIAGNOSIS — Z87820 Personal history of traumatic brain injury: Secondary | ICD-10-CM | POA: Diagnosis not present

## 2023-06-03 DIAGNOSIS — Z87820 Personal history of traumatic brain injury: Secondary | ICD-10-CM | POA: Diagnosis not present

## 2023-06-04 DIAGNOSIS — Z87820 Personal history of traumatic brain injury: Secondary | ICD-10-CM | POA: Diagnosis not present

## 2023-06-05 DIAGNOSIS — Z87820 Personal history of traumatic brain injury: Secondary | ICD-10-CM | POA: Diagnosis not present

## 2023-06-06 DIAGNOSIS — Z87820 Personal history of traumatic brain injury: Secondary | ICD-10-CM | POA: Diagnosis not present

## 2023-06-07 DIAGNOSIS — Z87820 Personal history of traumatic brain injury: Secondary | ICD-10-CM | POA: Diagnosis not present

## 2023-06-08 DIAGNOSIS — Z87820 Personal history of traumatic brain injury: Secondary | ICD-10-CM | POA: Diagnosis not present

## 2023-06-09 DIAGNOSIS — Z87820 Personal history of traumatic brain injury: Secondary | ICD-10-CM | POA: Diagnosis not present

## 2023-06-10 DIAGNOSIS — Z87820 Personal history of traumatic brain injury: Secondary | ICD-10-CM | POA: Diagnosis not present

## 2023-06-11 DIAGNOSIS — Z87820 Personal history of traumatic brain injury: Secondary | ICD-10-CM | POA: Diagnosis not present

## 2023-06-12 DIAGNOSIS — Z87820 Personal history of traumatic brain injury: Secondary | ICD-10-CM | POA: Diagnosis not present

## 2023-06-13 DIAGNOSIS — Z87820 Personal history of traumatic brain injury: Secondary | ICD-10-CM | POA: Diagnosis not present

## 2023-06-14 DIAGNOSIS — Z87820 Personal history of traumatic brain injury: Secondary | ICD-10-CM | POA: Diagnosis not present

## 2023-06-15 DIAGNOSIS — Z87820 Personal history of traumatic brain injury: Secondary | ICD-10-CM | POA: Diagnosis not present

## 2023-06-16 DIAGNOSIS — Z87820 Personal history of traumatic brain injury: Secondary | ICD-10-CM | POA: Diagnosis not present

## 2023-06-17 DIAGNOSIS — Z87820 Personal history of traumatic brain injury: Secondary | ICD-10-CM | POA: Diagnosis not present

## 2023-06-18 DIAGNOSIS — Z87820 Personal history of traumatic brain injury: Secondary | ICD-10-CM | POA: Diagnosis not present

## 2023-06-19 DIAGNOSIS — Z87820 Personal history of traumatic brain injury: Secondary | ICD-10-CM | POA: Diagnosis not present

## 2023-06-20 DIAGNOSIS — Z87820 Personal history of traumatic brain injury: Secondary | ICD-10-CM | POA: Diagnosis not present

## 2023-06-21 DIAGNOSIS — Z87820 Personal history of traumatic brain injury: Secondary | ICD-10-CM | POA: Diagnosis not present

## 2023-06-22 DIAGNOSIS — Z87820 Personal history of traumatic brain injury: Secondary | ICD-10-CM | POA: Diagnosis not present

## 2023-06-23 DIAGNOSIS — Z87820 Personal history of traumatic brain injury: Secondary | ICD-10-CM | POA: Diagnosis not present

## 2023-06-24 DIAGNOSIS — Z87820 Personal history of traumatic brain injury: Secondary | ICD-10-CM | POA: Diagnosis not present

## 2023-06-25 DIAGNOSIS — Z87820 Personal history of traumatic brain injury: Secondary | ICD-10-CM | POA: Diagnosis not present

## 2023-06-26 DIAGNOSIS — Z87820 Personal history of traumatic brain injury: Secondary | ICD-10-CM | POA: Diagnosis not present

## 2023-06-27 DIAGNOSIS — Z87820 Personal history of traumatic brain injury: Secondary | ICD-10-CM | POA: Diagnosis not present

## 2023-06-28 DIAGNOSIS — Z87820 Personal history of traumatic brain injury: Secondary | ICD-10-CM | POA: Diagnosis not present

## 2023-06-29 DIAGNOSIS — Z87820 Personal history of traumatic brain injury: Secondary | ICD-10-CM | POA: Diagnosis not present

## 2023-06-30 DIAGNOSIS — Z87820 Personal history of traumatic brain injury: Secondary | ICD-10-CM | POA: Diagnosis not present

## 2023-07-01 DIAGNOSIS — Z87820 Personal history of traumatic brain injury: Secondary | ICD-10-CM | POA: Diagnosis not present

## 2023-07-02 DIAGNOSIS — Z87820 Personal history of traumatic brain injury: Secondary | ICD-10-CM | POA: Diagnosis not present

## 2023-07-03 DIAGNOSIS — Z87820 Personal history of traumatic brain injury: Secondary | ICD-10-CM | POA: Diagnosis not present

## 2023-07-04 DIAGNOSIS — Z87820 Personal history of traumatic brain injury: Secondary | ICD-10-CM | POA: Diagnosis not present

## 2023-07-05 DIAGNOSIS — Z87820 Personal history of traumatic brain injury: Secondary | ICD-10-CM | POA: Diagnosis not present

## 2023-07-06 DIAGNOSIS — Z87820 Personal history of traumatic brain injury: Secondary | ICD-10-CM | POA: Diagnosis not present

## 2023-07-07 DIAGNOSIS — Z87820 Personal history of traumatic brain injury: Secondary | ICD-10-CM | POA: Diagnosis not present

## 2023-07-08 DIAGNOSIS — Z87820 Personal history of traumatic brain injury: Secondary | ICD-10-CM | POA: Diagnosis not present

## 2023-07-09 DIAGNOSIS — Z87820 Personal history of traumatic brain injury: Secondary | ICD-10-CM | POA: Diagnosis not present

## 2023-07-10 DIAGNOSIS — Z87820 Personal history of traumatic brain injury: Secondary | ICD-10-CM | POA: Diagnosis not present

## 2023-07-11 DIAGNOSIS — Z87820 Personal history of traumatic brain injury: Secondary | ICD-10-CM | POA: Diagnosis not present

## 2023-07-12 DIAGNOSIS — Z87820 Personal history of traumatic brain injury: Secondary | ICD-10-CM | POA: Diagnosis not present

## 2023-07-13 DIAGNOSIS — Z87820 Personal history of traumatic brain injury: Secondary | ICD-10-CM | POA: Diagnosis not present

## 2023-07-14 DIAGNOSIS — Z87820 Personal history of traumatic brain injury: Secondary | ICD-10-CM | POA: Diagnosis not present

## 2023-07-15 DIAGNOSIS — Z87820 Personal history of traumatic brain injury: Secondary | ICD-10-CM | POA: Diagnosis not present

## 2023-07-16 DIAGNOSIS — Z87820 Personal history of traumatic brain injury: Secondary | ICD-10-CM | POA: Diagnosis not present

## 2023-07-17 DIAGNOSIS — Z87820 Personal history of traumatic brain injury: Secondary | ICD-10-CM | POA: Diagnosis not present

## 2023-07-18 DIAGNOSIS — Z87820 Personal history of traumatic brain injury: Secondary | ICD-10-CM | POA: Diagnosis not present

## 2023-07-19 DIAGNOSIS — Z87820 Personal history of traumatic brain injury: Secondary | ICD-10-CM | POA: Diagnosis not present

## 2023-07-20 DIAGNOSIS — Z87820 Personal history of traumatic brain injury: Secondary | ICD-10-CM | POA: Diagnosis not present

## 2023-07-21 DIAGNOSIS — Z87820 Personal history of traumatic brain injury: Secondary | ICD-10-CM | POA: Diagnosis not present

## 2023-07-22 DIAGNOSIS — Z87820 Personal history of traumatic brain injury: Secondary | ICD-10-CM | POA: Diagnosis not present

## 2023-07-23 DIAGNOSIS — Z87820 Personal history of traumatic brain injury: Secondary | ICD-10-CM | POA: Diagnosis not present

## 2023-07-24 DIAGNOSIS — Z87820 Personal history of traumatic brain injury: Secondary | ICD-10-CM | POA: Diagnosis not present

## 2023-07-25 DIAGNOSIS — Z87820 Personal history of traumatic brain injury: Secondary | ICD-10-CM | POA: Diagnosis not present

## 2023-07-26 DIAGNOSIS — Z87820 Personal history of traumatic brain injury: Secondary | ICD-10-CM | POA: Diagnosis not present

## 2023-07-27 DIAGNOSIS — Z87820 Personal history of traumatic brain injury: Secondary | ICD-10-CM | POA: Diagnosis not present

## 2023-07-28 DIAGNOSIS — Z87820 Personal history of traumatic brain injury: Secondary | ICD-10-CM | POA: Diagnosis not present

## 2023-07-29 DIAGNOSIS — Z87820 Personal history of traumatic brain injury: Secondary | ICD-10-CM | POA: Diagnosis not present

## 2023-07-30 DIAGNOSIS — Z87820 Personal history of traumatic brain injury: Secondary | ICD-10-CM | POA: Diagnosis not present

## 2023-07-31 DIAGNOSIS — Z87820 Personal history of traumatic brain injury: Secondary | ICD-10-CM | POA: Diagnosis not present

## 2023-08-01 DIAGNOSIS — Z87820 Personal history of traumatic brain injury: Secondary | ICD-10-CM | POA: Diagnosis not present

## 2023-08-02 DIAGNOSIS — Z87820 Personal history of traumatic brain injury: Secondary | ICD-10-CM | POA: Diagnosis not present

## 2023-08-03 DIAGNOSIS — Z87820 Personal history of traumatic brain injury: Secondary | ICD-10-CM | POA: Diagnosis not present

## 2023-08-04 DIAGNOSIS — Z87820 Personal history of traumatic brain injury: Secondary | ICD-10-CM | POA: Diagnosis not present

## 2023-08-05 DIAGNOSIS — Z87820 Personal history of traumatic brain injury: Secondary | ICD-10-CM | POA: Diagnosis not present

## 2023-08-06 DIAGNOSIS — Z87820 Personal history of traumatic brain injury: Secondary | ICD-10-CM | POA: Diagnosis not present

## 2023-08-07 DIAGNOSIS — Z87820 Personal history of traumatic brain injury: Secondary | ICD-10-CM | POA: Diagnosis not present

## 2023-08-08 DIAGNOSIS — Z87820 Personal history of traumatic brain injury: Secondary | ICD-10-CM | POA: Diagnosis not present

## 2023-08-09 DIAGNOSIS — Z87820 Personal history of traumatic brain injury: Secondary | ICD-10-CM | POA: Diagnosis not present

## 2023-08-10 DIAGNOSIS — Z87820 Personal history of traumatic brain injury: Secondary | ICD-10-CM | POA: Diagnosis not present

## 2023-08-11 DIAGNOSIS — Z87820 Personal history of traumatic brain injury: Secondary | ICD-10-CM | POA: Diagnosis not present

## 2023-08-12 DIAGNOSIS — Z87820 Personal history of traumatic brain injury: Secondary | ICD-10-CM | POA: Diagnosis not present

## 2023-08-13 DIAGNOSIS — Z87820 Personal history of traumatic brain injury: Secondary | ICD-10-CM | POA: Diagnosis not present

## 2023-08-14 DIAGNOSIS — Z87820 Personal history of traumatic brain injury: Secondary | ICD-10-CM | POA: Diagnosis not present

## 2023-08-15 DIAGNOSIS — Z87820 Personal history of traumatic brain injury: Secondary | ICD-10-CM | POA: Diagnosis not present

## 2023-08-16 DIAGNOSIS — Z87820 Personal history of traumatic brain injury: Secondary | ICD-10-CM | POA: Diagnosis not present

## 2023-08-17 DIAGNOSIS — Z87820 Personal history of traumatic brain injury: Secondary | ICD-10-CM | POA: Diagnosis not present

## 2023-08-18 DIAGNOSIS — Z87820 Personal history of traumatic brain injury: Secondary | ICD-10-CM | POA: Diagnosis not present

## 2023-08-19 DIAGNOSIS — Z87820 Personal history of traumatic brain injury: Secondary | ICD-10-CM | POA: Diagnosis not present

## 2023-08-20 DIAGNOSIS — Z87820 Personal history of traumatic brain injury: Secondary | ICD-10-CM | POA: Diagnosis not present

## 2023-08-21 DIAGNOSIS — Z87820 Personal history of traumatic brain injury: Secondary | ICD-10-CM | POA: Diagnosis not present

## 2023-08-22 DIAGNOSIS — Z87820 Personal history of traumatic brain injury: Secondary | ICD-10-CM | POA: Diagnosis not present

## 2023-08-23 DIAGNOSIS — Z87820 Personal history of traumatic brain injury: Secondary | ICD-10-CM | POA: Diagnosis not present

## 2023-08-24 DIAGNOSIS — Z87820 Personal history of traumatic brain injury: Secondary | ICD-10-CM | POA: Diagnosis not present

## 2023-08-25 DIAGNOSIS — Z87820 Personal history of traumatic brain injury: Secondary | ICD-10-CM | POA: Diagnosis not present

## 2023-08-26 DIAGNOSIS — Z87820 Personal history of traumatic brain injury: Secondary | ICD-10-CM | POA: Diagnosis not present

## 2023-08-27 DIAGNOSIS — Z87820 Personal history of traumatic brain injury: Secondary | ICD-10-CM | POA: Diagnosis not present

## 2023-08-28 DIAGNOSIS — Z87820 Personal history of traumatic brain injury: Secondary | ICD-10-CM | POA: Diagnosis not present

## 2023-08-29 DIAGNOSIS — Z87820 Personal history of traumatic brain injury: Secondary | ICD-10-CM | POA: Diagnosis not present

## 2023-08-30 DIAGNOSIS — Z87820 Personal history of traumatic brain injury: Secondary | ICD-10-CM | POA: Diagnosis not present

## 2023-08-31 DIAGNOSIS — Z87820 Personal history of traumatic brain injury: Secondary | ICD-10-CM | POA: Diagnosis not present

## 2023-09-01 DIAGNOSIS — Z87820 Personal history of traumatic brain injury: Secondary | ICD-10-CM | POA: Diagnosis not present

## 2023-09-02 DIAGNOSIS — Z87820 Personal history of traumatic brain injury: Secondary | ICD-10-CM | POA: Diagnosis not present

## 2023-09-03 DIAGNOSIS — Z87820 Personal history of traumatic brain injury: Secondary | ICD-10-CM | POA: Diagnosis not present

## 2023-09-04 DIAGNOSIS — Z87820 Personal history of traumatic brain injury: Secondary | ICD-10-CM | POA: Diagnosis not present

## 2023-09-05 DIAGNOSIS — Z87820 Personal history of traumatic brain injury: Secondary | ICD-10-CM | POA: Diagnosis not present

## 2023-09-06 DIAGNOSIS — Z87820 Personal history of traumatic brain injury: Secondary | ICD-10-CM | POA: Diagnosis not present

## 2023-09-07 DIAGNOSIS — Z87820 Personal history of traumatic brain injury: Secondary | ICD-10-CM | POA: Diagnosis not present

## 2023-09-08 DIAGNOSIS — Z87820 Personal history of traumatic brain injury: Secondary | ICD-10-CM | POA: Diagnosis not present

## 2023-09-09 DIAGNOSIS — Z87820 Personal history of traumatic brain injury: Secondary | ICD-10-CM | POA: Diagnosis not present

## 2023-09-10 DIAGNOSIS — Z87820 Personal history of traumatic brain injury: Secondary | ICD-10-CM | POA: Diagnosis not present

## 2023-09-11 DIAGNOSIS — Z87820 Personal history of traumatic brain injury: Secondary | ICD-10-CM | POA: Diagnosis not present

## 2023-09-12 DIAGNOSIS — Z87820 Personal history of traumatic brain injury: Secondary | ICD-10-CM | POA: Diagnosis not present

## 2023-09-13 DIAGNOSIS — Z87820 Personal history of traumatic brain injury: Secondary | ICD-10-CM | POA: Diagnosis not present

## 2023-09-14 DIAGNOSIS — Z87820 Personal history of traumatic brain injury: Secondary | ICD-10-CM | POA: Diagnosis not present

## 2023-09-15 DIAGNOSIS — Z87820 Personal history of traumatic brain injury: Secondary | ICD-10-CM | POA: Diagnosis not present

## 2023-09-16 DIAGNOSIS — Z87820 Personal history of traumatic brain injury: Secondary | ICD-10-CM | POA: Diagnosis not present

## 2023-09-17 DIAGNOSIS — Z87820 Personal history of traumatic brain injury: Secondary | ICD-10-CM | POA: Diagnosis not present

## 2023-09-18 DIAGNOSIS — Z87820 Personal history of traumatic brain injury: Secondary | ICD-10-CM | POA: Diagnosis not present

## 2023-09-19 DIAGNOSIS — Z87820 Personal history of traumatic brain injury: Secondary | ICD-10-CM | POA: Diagnosis not present

## 2023-09-20 DIAGNOSIS — Z87820 Personal history of traumatic brain injury: Secondary | ICD-10-CM | POA: Diagnosis not present

## 2023-09-21 DIAGNOSIS — Z87820 Personal history of traumatic brain injury: Secondary | ICD-10-CM | POA: Diagnosis not present

## 2023-09-22 DIAGNOSIS — Z87820 Personal history of traumatic brain injury: Secondary | ICD-10-CM | POA: Diagnosis not present

## 2023-09-23 DIAGNOSIS — Z87820 Personal history of traumatic brain injury: Secondary | ICD-10-CM | POA: Diagnosis not present

## 2023-09-24 DIAGNOSIS — Z87820 Personal history of traumatic brain injury: Secondary | ICD-10-CM | POA: Diagnosis not present

## 2023-09-25 DIAGNOSIS — Z87820 Personal history of traumatic brain injury: Secondary | ICD-10-CM | POA: Diagnosis not present

## 2023-09-26 DIAGNOSIS — Z87820 Personal history of traumatic brain injury: Secondary | ICD-10-CM | POA: Diagnosis not present

## 2023-09-27 DIAGNOSIS — Z87820 Personal history of traumatic brain injury: Secondary | ICD-10-CM | POA: Diagnosis not present

## 2023-09-28 DIAGNOSIS — Z87820 Personal history of traumatic brain injury: Secondary | ICD-10-CM | POA: Diagnosis not present

## 2023-09-29 DIAGNOSIS — Z87820 Personal history of traumatic brain injury: Secondary | ICD-10-CM | POA: Diagnosis not present

## 2023-09-30 DIAGNOSIS — Z87820 Personal history of traumatic brain injury: Secondary | ICD-10-CM | POA: Diagnosis not present

## 2023-10-01 DIAGNOSIS — Z87820 Personal history of traumatic brain injury: Secondary | ICD-10-CM | POA: Diagnosis not present

## 2023-10-02 DIAGNOSIS — Z87820 Personal history of traumatic brain injury: Secondary | ICD-10-CM | POA: Diagnosis not present

## 2023-10-03 DIAGNOSIS — Z87820 Personal history of traumatic brain injury: Secondary | ICD-10-CM | POA: Diagnosis not present

## 2023-10-04 DIAGNOSIS — Z87820 Personal history of traumatic brain injury: Secondary | ICD-10-CM | POA: Diagnosis not present

## 2023-10-05 DIAGNOSIS — Z87820 Personal history of traumatic brain injury: Secondary | ICD-10-CM | POA: Diagnosis not present

## 2023-10-06 DIAGNOSIS — Z87820 Personal history of traumatic brain injury: Secondary | ICD-10-CM | POA: Diagnosis not present

## 2023-10-07 DIAGNOSIS — Z87820 Personal history of traumatic brain injury: Secondary | ICD-10-CM | POA: Diagnosis not present

## 2023-10-08 DIAGNOSIS — Z87820 Personal history of traumatic brain injury: Secondary | ICD-10-CM | POA: Diagnosis not present

## 2023-10-09 DIAGNOSIS — Z87820 Personal history of traumatic brain injury: Secondary | ICD-10-CM | POA: Diagnosis not present

## 2023-10-10 DIAGNOSIS — Z87820 Personal history of traumatic brain injury: Secondary | ICD-10-CM | POA: Diagnosis not present

## 2023-10-11 DIAGNOSIS — Z87820 Personal history of traumatic brain injury: Secondary | ICD-10-CM | POA: Diagnosis not present

## 2023-10-12 ENCOUNTER — Encounter: Payer: Self-pay | Admitting: Internal Medicine

## 2023-10-12 ENCOUNTER — Ambulatory Visit (INDEPENDENT_AMBULATORY_CARE_PROVIDER_SITE_OTHER)

## 2023-10-12 ENCOUNTER — Ambulatory Visit (INDEPENDENT_AMBULATORY_CARE_PROVIDER_SITE_OTHER): Admitting: Internal Medicine

## 2023-10-12 VITALS — BP 116/76 | HR 73 | Temp 98.1°F | Ht 60.0 in | Wt 122.6 lb

## 2023-10-12 DIAGNOSIS — R0789 Other chest pain: Secondary | ICD-10-CM

## 2023-10-12 DIAGNOSIS — R202 Paresthesia of skin: Secondary | ICD-10-CM

## 2023-10-12 DIAGNOSIS — E559 Vitamin D deficiency, unspecified: Secondary | ICD-10-CM | POA: Diagnosis not present

## 2023-10-12 DIAGNOSIS — R531 Weakness: Secondary | ICD-10-CM | POA: Diagnosis not present

## 2023-10-12 DIAGNOSIS — Z87820 Personal history of traumatic brain injury: Secondary | ICD-10-CM | POA: Diagnosis not present

## 2023-10-12 DIAGNOSIS — G250 Essential tremor: Secondary | ICD-10-CM | POA: Diagnosis not present

## 2023-10-12 DIAGNOSIS — B029 Zoster without complications: Secondary | ICD-10-CM | POA: Diagnosis not present

## 2023-10-12 LAB — COMPREHENSIVE METABOLIC PANEL WITH GFR
ALT: 13 U/L (ref 0–35)
AST: 21 U/L (ref 0–37)
Albumin: 4 g/dL (ref 3.5–5.2)
Alkaline Phosphatase: 72 U/L (ref 39–117)
BUN: 25 mg/dL — ABNORMAL HIGH (ref 6–23)
CO2: 30 meq/L (ref 19–32)
Calcium: 8.7 mg/dL (ref 8.4–10.5)
Chloride: 106 meq/L (ref 96–112)
Creatinine, Ser: 0.79 mg/dL (ref 0.40–1.20)
GFR: 66.5 mL/min (ref >60.00)
Glucose, Bld: 96 mg/dL (ref 70–99)
Potassium: 4.8 meq/L (ref 3.5–5.1)
Sodium: 141 meq/L (ref 135–145)
Total Bilirubin: 0.3 mg/dL (ref 0.2–1.2)
Total Protein: 6.8 g/dL (ref 6.0–8.3)

## 2023-10-12 LAB — CBC WITH DIFFERENTIAL/PLATELET
Basophils Absolute: 0 10*3/uL (ref 0.0–0.1)
Basophils Relative: 0.7 % (ref 0.0–3.0)
Eosinophils Absolute: 0.1 10*3/uL (ref 0.0–0.7)
Eosinophils Relative: 2.4 % (ref 0.0–5.0)
HCT: 39 % (ref 36.0–46.0)
Hemoglobin: 12.5 g/dL (ref 12.0–15.0)
Lymphocytes Relative: 28.5 % (ref 12.0–46.0)
Lymphs Abs: 1.5 10*3/uL (ref 0.7–4.0)
MCHC: 32.1 g/dL (ref 30.0–36.0)
MCV: 76.9 fl — ABNORMAL LOW (ref 78.0–100.0)
Monocytes Absolute: 0.6 10*3/uL (ref 0.1–1.0)
Monocytes Relative: 12.1 % — ABNORMAL HIGH (ref 3.0–12.0)
Neutro Abs: 3 10*3/uL (ref 1.4–7.7)
Neutrophils Relative %: 56.3 % (ref 43.0–77.0)
Platelets: 212 10*3/uL (ref 150.0–400.0)
RBC: 5.07 Mil/uL (ref 3.87–5.11)
RDW: 17.3 % — ABNORMAL HIGH (ref 11.5–15.5)
WBC: 5.3 10*3/uL (ref 4.0–10.5)

## 2023-10-12 LAB — T4, FREE: Free T4: 0.83 ng/dL (ref 0.60–1.60)

## 2023-10-12 LAB — VITAMIN B12: Vitamin B-12: 214 pg/mL (ref 211–911)

## 2023-10-12 LAB — TSH: TSH: 2.73 u[IU]/mL (ref 0.35–5.50)

## 2023-10-12 MED ORDER — FAMOTIDINE 20 MG PO TABS: 20.0000 mg | ORAL_TABLET | Freq: Two times a day (BID) | ORAL | 5 refills | Status: DC

## 2023-10-12 MED ORDER — VALACYCLOVIR HCL 500 MG PO TABS: 500.0000 mg | ORAL_TABLET | Freq: Three times a day (TID) | ORAL | 1 refills | Status: AC

## 2023-10-12 MED ORDER — DIAZEPAM 2 MG PO TABS: 2.0000 mg | ORAL_TABLET | Freq: Every day | ORAL | 5 refills | Status: AC

## 2023-10-12 MED ORDER — MELOXICAM 7.5 MG PO TABS: 7.5000 mg | ORAL_TABLET | Freq: Every evening | ORAL | 5 refills | Status: DC

## 2023-10-12 NOTE — Progress Notes (Signed)
 Subjective:  Patient ID: Crystal Rodgers, female    DOB: 02-15-1933  Age: 89 y.o. MRN: [id number]  CC: Ear Pain (Right ear pain, present since her fall) and Fall (Had a fall about 3 weeks ago, has been having increased weakness first noticed around 3/18)   HPI Crystal Rodgers presents for a fall 2 weeks - ?syncope vs not C/o weakness, fatigue, insomnia, rash x 1-2 d Here w/son Sergey  Outpatient Medications Prior to Visit  Medication Sig Dispense Refill   AMBULATORY NON FORMULARY MEDICATION Walker; 2 wheels, 2 tennis balls Dx: gait instability R26.81 1 Device 0   aspirin EC 81 MG tablet Take 81 mg by mouth daily. Swallow whole.     Cholecalciferol (VITAMIN D3) 50 MCG (2000 UT) capsule Take 1 capsule (2,000 Units total) by mouth daily. 100 capsule 3   fluticasone (FLONASE) 50 MCG/ACT nasal spray SPRAY 2 SPRAYS INTO EACH NOSTRIL EVERY DAY 48 mL 3   ketotifen (ZADITOR) 0.035 % ophthalmic solution Place 2 drops into both eyes in the morning and at bedtime. 10 mL 3   loratadine (CLARITIN) 10 MG tablet Take 1 tablet (10 mg total) by mouth daily. 30 tablet 11   ondansetron (ZOFRAN) 4 MG tablet Take 1 tablet (4 mg total) by mouth every 8 (eight) hours as needed for nausea or vomiting. 20 tablet 0   primidone (MYSOLINE) 50 MG tablet Take 2 tablets (100 mg total) by mouth at bedtime. 180 tablet 3   triamcinolone cream (KENALOG) 0.5 % Apply 1 Application topically 3 (three) times daily. 120 g 1   diazepam (VALIUM) 2 MG tablet TAKE 1 TABLET BY MOUTH AT BEDTIME. 30 tablet 5   meloxicam (MOBIC) 7.5 MG tablet Take 1 tablet (7.5 mg total) by mouth at bedtime. With food 30 tablet 5   Vitamin D, Ergocalciferol, (DRISDOL) 1.25 MG (50000 UNIT) CAPS capsule Take 1 capsule (50,000 Units total) by mouth every 14 (fourteen) days. 6 capsule 3   B Complex-Folic Acid (B COMPLEX PLUS) TABS Take 1 tablet by mouth daily. (Patient not taking: Reported on 10/12/2023) 100 tablet 3   No facility-administered  medications prior to visit.    ROS: Review of Systems  Constitutional:  Positive for fatigue. Negative for activity change, appetite change, chills and unexpected weight change.  HENT:  Negative for congestion, mouth sores, postnasal drip, sinus pressure and sore throat.   Eyes:  Negative for visual disturbance.  Respiratory:  Negative for cough and chest tightness.   Cardiovascular:  Negative for leg swelling.  Gastrointestinal:  Positive for nausea. Negative for abdominal pain.  Genitourinary:  Negative for difficulty urinating, frequency and vaginal pain.  Musculoskeletal:  Positive for arthralgias and gait problem. Negative for back pain.  Skin:  Negative for pallor and rash.  Neurological:  Positive for weakness. Negative for dizziness, tremors, numbness and headaches.  Hematological:  Bruises/bleeds easily.  Psychiatric/Behavioral:  Positive for decreased concentration, dysphoric mood and sleep disturbance. Negative for behavioral problems, confusion and suicidal ideas. The patient is nervous/anxious.     Objective:  BP 116/76 (BP Location: Left Arm, Patient Position: Sitting)   Pulse 73   Temp 98.1 F (36.7 C) (Temporal)   Ht 5' (1.524 m)   Wt 122 lb 9.6 oz (55.6 kg)   LMP  (LMP Unknown)   SpO2 94%   BMI 23.94 kg/m   BP Readings from Last 3 Encounters:  10/12/23 116/76  05/13/23 120/70  04/22/23 110/70    Wt Readings from Last 3 Encounters:  10/12/23 122 lb 9.6 oz (55.6 kg)  05/13/23 123 lb (55.8 kg)  04/22/23 123 lb (55.8 kg)    Physical Exam Constitutional:      General: She is not in acute distress.    Appearance: Normal appearance. She is well-developed.  HENT:     Head: Normocephalic.     Right Ear: External ear normal.     Left Ear: External ear normal.     Nose: Nose normal.  Eyes:     General:        Right eye: No discharge.        Left eye: No discharge.     Conjunctiva/sclera: Conjunctivae normal.     Pupils: Pupils are equal, round, and  reactive to light.  Neck:     Thyroid: No thyromegaly.     Vascular: No JVD.     Trachea: No tracheal deviation.  Cardiovascular:     Rate and Rhythm: Normal rate and regular rhythm.     Heart sounds: Normal heart sounds.  Pulmonary:     Effort: No respiratory distress.     Breath sounds: No stridor. No wheezing.  Abdominal:     General: Bowel sounds are normal. There is no distension.     Palpations: Abdomen is soft. There is no mass.     Tenderness: There is no abdominal tenderness. There is no guarding or rebound.  Musculoskeletal:        General: Tenderness present.     Cervical back: Normal range of motion and neck supple. No rigidity.  Lymphadenopathy:     Cervical: No cervical adenopathy.  Skin:    Findings: No erythema or rash.  Neurological:     Mental Status: She is oriented to person, place, and time.     Cranial Nerves: No cranial nerve deficit.     Motor: Weakness present. No abnormal muscle tone.     Coordination: Coordination abnormal.     Deep Tendon Reflexes: Reflexes normal.  Psychiatric:        Behavior: Behavior normal.        Thought Content: Thought content normal.        Judgment: Judgment normal.   R chest/ribs tender R arm w/a bruise Head tremor  H zoster rash on the  R wrist B TMs - WNL   A total time of 45 minutes was spent preparing to see the patient, reviewing tests and other medical records.  Also, obtaining history and performing comprehensive physical exam.  Additionally, counseling the patient regarding the above listed issues w/pt and her son.   Finally, documenting clinical information in the health records, coordination of care, educating the patient. It is a complex case.   Lab Results  Component Value Date   WBC 5.2 04/20/2023   HGB 12.8 04/20/2023   HCT 40.9 04/20/2023   PLT 163 04/20/2023   GLUCOSE 85 04/20/2023   CHOL 184 10/29/2021   TRIG 204.0 (H) 10/29/2021   HDL 56.30 10/29/2021   LDLDIRECT 115.0 10/29/2021   ALT 11  10/26/2022   AST 18 10/26/2022   NA 140 04/20/2023   K 4.2 04/20/2023   CL 108 04/20/2023   CREATININE 0.70 04/20/2023   BUN 16 04/20/2023   CO2 23 04/20/2023   TSH 2.57 10/26/2022    No results found.  Assessment & Plan:   Problem List Items Addressed This Visit     Essential tremor    F/u w/Dr Tat On Primidone      Relevant  Orders   CBC with Differential/Platelet   Comprehensive metabolic panel with GFR   T4, free   TSH   Urinalysis   Vitamin B12   Paresthesia   Relevant Orders   T4, free   TSH   Vitamin B12   Vitamin D deficiency   Continue with vitamin D 16109 bi-weekly      Relevant Orders   CBC with Differential/Platelet   Comprehensive metabolic panel with GFR   T4, free   TSH   Urinalysis   Vitamin B12   Herpes zoster - Primary   H zoster rash on the  R wrist Valtrex po      Relevant Medications   valACYclovir (VALTREX) 500 MG tablet   Other Visit Diagnoses       Chest wall pain       Relevant Orders   DG Ribs Unilateral Right   CBC with Differential/Platelet   Comprehensive metabolic panel with GFR   T4, free   TSH   Urinalysis   Vitamin B12     Weakness       Relevant Orders   CBC with Differential/Platelet   TSH   Urinalysis   Vitamin B12         Meds ordered this encounter  Medications   diazepam (VALIUM) 2 MG tablet    Sig: Take 1 tablet (2 mg total) by mouth at bedtime.    Dispense:  30 tablet    Refill:  5   meloxicam (MOBIC) 7.5 MG tablet    Sig: Take 1 tablet (7.5 mg total) by mouth at bedtime. With food    Dispense:  30 tablet    Refill:  5   valACYclovir (VALTREX) 500 MG tablet    Sig: Take 1 tablet (500 mg total) by mouth 3 (three) times daily.    Dispense:  21 tablet    Refill:  1   famotidine (PEPCID) 20 MG tablet    Sig: Take 1 tablet (20 mg total) by mouth 2 (two) times daily.    Dispense:  30 tablet    Refill:  5      Follow-up: Return in about 6 weeks (around 11/23/2023) for a follow-up  visit.  Sonda Primes, MD

## 2023-10-12 NOTE — Assessment & Plan Note (Signed)
Continue with vitamin D 62376 biweekly

## 2023-10-12 NOTE — Assessment & Plan Note (Signed)
 H zoster rash on the  R wrist Valtrex po

## 2023-10-12 NOTE — Assessment & Plan Note (Signed)
F/u w/Dr Tat On Primidone

## 2023-10-13 DIAGNOSIS — Z87820 Personal history of traumatic brain injury: Secondary | ICD-10-CM | POA: Diagnosis not present

## 2023-10-13 LAB — URINALYSIS, ROUTINE W REFLEX MICROSCOPIC
Bilirubin Urine: NEGATIVE
Ketones, ur: NEGATIVE
Nitrite: POSITIVE — AB
Specific Gravity, Urine: 1.015 (ref 1.000–1.030)
Total Protein, Urine: NEGATIVE
Urine Glucose: NEGATIVE
Urobilinogen, UA: 0.2 (ref 0.0–1.0)
pH: 6.5 (ref 5.0–8.0)

## 2023-10-14 ENCOUNTER — Other Ambulatory Visit: Payer: Self-pay | Admitting: Internal Medicine

## 2023-10-14 DIAGNOSIS — Z87820 Personal history of traumatic brain injury: Secondary | ICD-10-CM | POA: Diagnosis not present

## 2023-10-14 MED ORDER — CEPHALEXIN 500 MG PO CAPS: 500.0000 mg | ORAL_CAPSULE | Freq: Three times a day (TID) | ORAL | 1 refills | Status: DC

## 2023-10-15 DIAGNOSIS — Z87820 Personal history of traumatic brain injury: Secondary | ICD-10-CM | POA: Diagnosis not present

## 2023-10-16 DIAGNOSIS — Z87820 Personal history of traumatic brain injury: Secondary | ICD-10-CM | POA: Diagnosis not present

## 2023-10-17 DIAGNOSIS — Z87820 Personal history of traumatic brain injury: Secondary | ICD-10-CM | POA: Diagnosis not present

## 2023-10-18 DIAGNOSIS — Z87820 Personal history of traumatic brain injury: Secondary | ICD-10-CM | POA: Diagnosis not present

## 2023-10-19 DIAGNOSIS — Z87820 Personal history of traumatic brain injury: Secondary | ICD-10-CM | POA: Diagnosis not present

## 2023-10-20 DIAGNOSIS — Z87820 Personal history of traumatic brain injury: Secondary | ICD-10-CM | POA: Diagnosis not present

## 2023-10-21 DIAGNOSIS — Z87820 Personal history of traumatic brain injury: Secondary | ICD-10-CM | POA: Diagnosis not present

## 2023-10-22 DIAGNOSIS — Z87820 Personal history of traumatic brain injury: Secondary | ICD-10-CM | POA: Diagnosis not present

## 2023-10-23 DIAGNOSIS — Z87820 Personal history of traumatic brain injury: Secondary | ICD-10-CM | POA: Diagnosis not present

## 2023-10-24 DIAGNOSIS — Z87820 Personal history of traumatic brain injury: Secondary | ICD-10-CM | POA: Diagnosis not present

## 2023-10-25 DIAGNOSIS — Z87820 Personal history of traumatic brain injury: Secondary | ICD-10-CM | POA: Diagnosis not present

## 2023-10-26 ENCOUNTER — Telehealth: Payer: Self-pay | Admitting: Internal Medicine

## 2023-10-26 DIAGNOSIS — Z87820 Personal history of traumatic brain injury: Secondary | ICD-10-CM | POA: Diagnosis not present

## 2023-10-26 NOTE — Telephone Encounter (Signed)
 Copied from CRM [id number]. Topic: General - Other >> Oct 25, 2023  3:55 PM Adonis Hoot wrote: Reason for CRM: Adell Hones from [facility] called to see if office has received Personal Care Services form that was faxed over on 10/18/2023? Cb#: [phone]

## 2023-10-27 DIAGNOSIS — Z87820 Personal history of traumatic brain injury: Secondary | ICD-10-CM | POA: Diagnosis not present

## 2023-10-28 DIAGNOSIS — Z87820 Personal history of traumatic brain injury: Secondary | ICD-10-CM | POA: Diagnosis not present

## 2023-10-29 DIAGNOSIS — Z87820 Personal history of traumatic brain injury: Secondary | ICD-10-CM | POA: Diagnosis not present

## 2023-10-30 DIAGNOSIS — Z87820 Personal history of traumatic brain injury: Secondary | ICD-10-CM | POA: Diagnosis not present

## 2023-10-31 DIAGNOSIS — Z87820 Personal history of traumatic brain injury: Secondary | ICD-10-CM | POA: Diagnosis not present

## 2023-11-01 DIAGNOSIS — Z87820 Personal history of traumatic brain injury: Secondary | ICD-10-CM | POA: Diagnosis not present

## 2023-11-02 DIAGNOSIS — Z87820 Personal history of traumatic brain injury: Secondary | ICD-10-CM | POA: Diagnosis not present

## 2023-11-02 NOTE — Telephone Encounter (Signed)
 Copied from CRM [id number]. Topic: Medical Record Request - Other >> Nov 01, 2023  4:53 PM Jethro Morrison wrote: Reason for CRM: RECEIVED CALL FROM [facility] WITH [employer] STATED SHE FAXED DOCUMENTATION ON 04/07 CALLED [employer] AND WAS ADVISED THEY DID NOT RECEIVE FAX. SANDRA WILL REFAX FORMS BUT SHE IS ASKING THAT SOMEONE FROM THE CLINIC CONTACT HER ONCE THE FAX IS RECEIVED. SHE CAN BE REACHED [phone] STATED THIS IS A SECURE VOICEMAIL TO LEAVE A MESSAGE.

## 2023-11-03 DIAGNOSIS — Z87820 Personal history of traumatic brain injury: Secondary | ICD-10-CM | POA: Diagnosis not present

## 2023-11-04 DIAGNOSIS — Z87820 Personal history of traumatic brain injury: Secondary | ICD-10-CM | POA: Diagnosis not present

## 2023-11-05 DIAGNOSIS — Z87820 Personal history of traumatic brain injury: Secondary | ICD-10-CM | POA: Diagnosis not present

## 2023-11-06 DIAGNOSIS — Z87820 Personal history of traumatic brain injury: Secondary | ICD-10-CM | POA: Diagnosis not present

## 2023-11-07 DIAGNOSIS — Z87820 Personal history of traumatic brain injury: Secondary | ICD-10-CM | POA: Diagnosis not present

## 2023-11-08 DIAGNOSIS — Z87820 Personal history of traumatic brain injury: Secondary | ICD-10-CM | POA: Diagnosis not present

## 2023-11-09 DIAGNOSIS — Z87820 Personal history of traumatic brain injury: Secondary | ICD-10-CM | POA: Diagnosis not present

## 2023-11-09 NOTE — Telephone Encounter (Signed)
 Forms have been placed on provider's desk. Once returned I will reach out to pt.

## 2023-11-09 NOTE — Telephone Encounter (Signed)
Forms received and placed in PCP's box.

## 2023-11-10 DIAGNOSIS — Z87820 Personal history of traumatic brain injury: Secondary | ICD-10-CM | POA: Diagnosis not present

## 2023-11-11 DIAGNOSIS — Z87820 Personal history of traumatic brain injury: Secondary | ICD-10-CM | POA: Diagnosis not present

## 2023-11-12 DIAGNOSIS — Z87820 Personal history of traumatic brain injury: Secondary | ICD-10-CM | POA: Diagnosis not present

## 2023-11-13 DIAGNOSIS — Z87820 Personal history of traumatic brain injury: Secondary | ICD-10-CM | POA: Diagnosis not present

## 2023-11-14 DIAGNOSIS — Z87820 Personal history of traumatic brain injury: Secondary | ICD-10-CM | POA: Diagnosis not present

## 2023-11-15 DIAGNOSIS — Z87820 Personal history of traumatic brain injury: Secondary | ICD-10-CM | POA: Diagnosis not present

## 2023-11-16 DIAGNOSIS — Z87820 Personal history of traumatic brain injury: Secondary | ICD-10-CM | POA: Diagnosis not present

## 2023-11-17 DIAGNOSIS — Z87820 Personal history of traumatic brain injury: Secondary | ICD-10-CM | POA: Diagnosis not present

## 2023-11-18 DIAGNOSIS — Z87820 Personal history of traumatic brain injury: Secondary | ICD-10-CM | POA: Diagnosis not present

## 2023-11-19 DIAGNOSIS — Z87820 Personal history of traumatic brain injury: Secondary | ICD-10-CM | POA: Diagnosis not present

## 2023-11-20 DIAGNOSIS — Z87820 Personal history of traumatic brain injury: Secondary | ICD-10-CM | POA: Diagnosis not present

## 2023-11-21 DIAGNOSIS — Z87820 Personal history of traumatic brain injury: Secondary | ICD-10-CM | POA: Diagnosis not present

## 2023-11-22 DIAGNOSIS — Z87820 Personal history of traumatic brain injury: Secondary | ICD-10-CM | POA: Diagnosis not present

## 2023-11-23 DIAGNOSIS — Z87820 Personal history of traumatic brain injury: Secondary | ICD-10-CM | POA: Diagnosis not present

## 2023-11-24 DIAGNOSIS — Z87820 Personal history of traumatic brain injury: Secondary | ICD-10-CM | POA: Diagnosis not present

## 2023-11-25 DIAGNOSIS — Z87820 Personal history of traumatic brain injury: Secondary | ICD-10-CM | POA: Diagnosis not present

## 2023-11-26 DIAGNOSIS — Z87820 Personal history of traumatic brain injury: Secondary | ICD-10-CM | POA: Diagnosis not present

## 2023-11-27 DIAGNOSIS — Z87820 Personal history of traumatic brain injury: Secondary | ICD-10-CM | POA: Diagnosis not present

## 2023-11-28 DIAGNOSIS — Z87820 Personal history of traumatic brain injury: Secondary | ICD-10-CM | POA: Diagnosis not present

## 2023-11-29 DIAGNOSIS — Z87820 Personal history of traumatic brain injury: Secondary | ICD-10-CM | POA: Diagnosis not present

## 2023-11-30 DIAGNOSIS — Z87820 Personal history of traumatic brain injury: Secondary | ICD-10-CM | POA: Diagnosis not present

## 2023-12-01 DIAGNOSIS — Z87820 Personal history of traumatic brain injury: Secondary | ICD-10-CM | POA: Diagnosis not present

## 2023-12-02 DIAGNOSIS — Z87820 Personal history of traumatic brain injury: Secondary | ICD-10-CM | POA: Diagnosis not present

## 2023-12-03 DIAGNOSIS — Z87820 Personal history of traumatic brain injury: Secondary | ICD-10-CM | POA: Diagnosis not present

## 2023-12-04 DIAGNOSIS — Z87820 Personal history of traumatic brain injury: Secondary | ICD-10-CM | POA: Diagnosis not present

## 2023-12-05 DIAGNOSIS — Z87820 Personal history of traumatic brain injury: Secondary | ICD-10-CM | POA: Diagnosis not present

## 2023-12-06 DIAGNOSIS — Z87820 Personal history of traumatic brain injury: Secondary | ICD-10-CM | POA: Diagnosis not present

## 2023-12-07 DIAGNOSIS — Z87820 Personal history of traumatic brain injury: Secondary | ICD-10-CM | POA: Diagnosis not present

## 2023-12-08 DIAGNOSIS — Z87820 Personal history of traumatic brain injury: Secondary | ICD-10-CM | POA: Diagnosis not present

## 2023-12-09 DIAGNOSIS — Z87820 Personal history of traumatic brain injury: Secondary | ICD-10-CM | POA: Diagnosis not present

## 2023-12-10 DIAGNOSIS — Z87820 Personal history of traumatic brain injury: Secondary | ICD-10-CM | POA: Diagnosis not present

## 2023-12-11 DIAGNOSIS — Z87820 Personal history of traumatic brain injury: Secondary | ICD-10-CM | POA: Diagnosis not present

## 2023-12-12 DIAGNOSIS — Z87820 Personal history of traumatic brain injury: Secondary | ICD-10-CM | POA: Diagnosis not present

## 2023-12-13 DIAGNOSIS — Z87820 Personal history of traumatic brain injury: Secondary | ICD-10-CM | POA: Diagnosis not present

## 2023-12-14 DIAGNOSIS — Z87820 Personal history of traumatic brain injury: Secondary | ICD-10-CM | POA: Diagnosis not present

## 2023-12-15 DIAGNOSIS — Z87820 Personal history of traumatic brain injury: Secondary | ICD-10-CM | POA: Diagnosis not present

## 2023-12-16 DIAGNOSIS — Z87820 Personal history of traumatic brain injury: Secondary | ICD-10-CM | POA: Diagnosis not present

## 2023-12-17 DIAGNOSIS — H2513 Age-related nuclear cataract, bilateral: Secondary | ICD-10-CM | POA: Insufficient documentation

## 2023-12-17 DIAGNOSIS — H35362 Drusen (degenerative) of macula, left eye: Secondary | ICD-10-CM | POA: Diagnosis not present

## 2023-12-17 DIAGNOSIS — Z87820 Personal history of traumatic brain injury: Secondary | ICD-10-CM | POA: Diagnosis not present

## 2023-12-17 DIAGNOSIS — H3554 Dystrophies primarily involving the retinal pigment epithelium: Secondary | ICD-10-CM | POA: Diagnosis not present

## 2023-12-17 DIAGNOSIS — H5703 Miosis: Secondary | ICD-10-CM | POA: Diagnosis not present

## 2023-12-17 DIAGNOSIS — H3562 Retinal hemorrhage, left eye: Secondary | ICD-10-CM | POA: Insufficient documentation

## 2023-12-18 DIAGNOSIS — Z87820 Personal history of traumatic brain injury: Secondary | ICD-10-CM | POA: Diagnosis not present

## 2023-12-19 DIAGNOSIS — Z87820 Personal history of traumatic brain injury: Secondary | ICD-10-CM | POA: Diagnosis not present

## 2023-12-20 DIAGNOSIS — Z87820 Personal history of traumatic brain injury: Secondary | ICD-10-CM | POA: Diagnosis not present

## 2023-12-21 DIAGNOSIS — Z87820 Personal history of traumatic brain injury: Secondary | ICD-10-CM | POA: Diagnosis not present

## 2023-12-22 DIAGNOSIS — Z87820 Personal history of traumatic brain injury: Secondary | ICD-10-CM | POA: Diagnosis not present

## 2023-12-23 DIAGNOSIS — Z87820 Personal history of traumatic brain injury: Secondary | ICD-10-CM | POA: Diagnosis not present

## 2023-12-24 DIAGNOSIS — Z87820 Personal history of traumatic brain injury: Secondary | ICD-10-CM | POA: Diagnosis not present

## 2023-12-25 DIAGNOSIS — Z87820 Personal history of traumatic brain injury: Secondary | ICD-10-CM | POA: Diagnosis not present

## 2023-12-26 DIAGNOSIS — Z87820 Personal history of traumatic brain injury: Secondary | ICD-10-CM | POA: Diagnosis not present

## 2023-12-27 DIAGNOSIS — Z87820 Personal history of traumatic brain injury: Secondary | ICD-10-CM | POA: Diagnosis not present

## 2023-12-28 DIAGNOSIS — Z87820 Personal history of traumatic brain injury: Secondary | ICD-10-CM | POA: Diagnosis not present

## 2023-12-29 DIAGNOSIS — Z87820 Personal history of traumatic brain injury: Secondary | ICD-10-CM | POA: Diagnosis not present

## 2023-12-30 DIAGNOSIS — Z87820 Personal history of traumatic brain injury: Secondary | ICD-10-CM | POA: Diagnosis not present

## 2023-12-31 DIAGNOSIS — Z87820 Personal history of traumatic brain injury: Secondary | ICD-10-CM | POA: Diagnosis not present

## 2024-01-01 DIAGNOSIS — Z87820 Personal history of traumatic brain injury: Secondary | ICD-10-CM | POA: Diagnosis not present

## 2024-01-02 ENCOUNTER — Other Ambulatory Visit: Payer: Self-pay | Admitting: Neurology

## 2024-01-02 ENCOUNTER — Other Ambulatory Visit: Payer: Self-pay | Admitting: Internal Medicine

## 2024-01-02 DIAGNOSIS — Z87820 Personal history of traumatic brain injury: Secondary | ICD-10-CM | POA: Diagnosis not present

## 2024-01-02 DIAGNOSIS — G250 Essential tremor: Secondary | ICD-10-CM

## 2024-01-02 DIAGNOSIS — G243 Spasmodic torticollis: Secondary | ICD-10-CM

## 2024-01-03 DIAGNOSIS — Z87820 Personal history of traumatic brain injury: Secondary | ICD-10-CM | POA: Diagnosis not present

## 2024-01-04 DIAGNOSIS — Z87820 Personal history of traumatic brain injury: Secondary | ICD-10-CM | POA: Diagnosis not present

## 2024-01-05 DIAGNOSIS — Z87820 Personal history of traumatic brain injury: Secondary | ICD-10-CM | POA: Diagnosis not present

## 2024-01-06 DIAGNOSIS — Z87820 Personal history of traumatic brain injury: Secondary | ICD-10-CM | POA: Diagnosis not present

## 2024-01-07 ENCOUNTER — Other Ambulatory Visit: Payer: Self-pay | Admitting: Internal Medicine

## 2024-01-07 DIAGNOSIS — Z87820 Personal history of traumatic brain injury: Secondary | ICD-10-CM | POA: Diagnosis not present

## 2024-01-08 DIAGNOSIS — Z87820 Personal history of traumatic brain injury: Secondary | ICD-10-CM | POA: Diagnosis not present

## 2024-01-09 DIAGNOSIS — Z87820 Personal history of traumatic brain injury: Secondary | ICD-10-CM | POA: Diagnosis not present

## 2024-01-10 DIAGNOSIS — Z87820 Personal history of traumatic brain injury: Secondary | ICD-10-CM | POA: Diagnosis not present

## 2024-01-11 DIAGNOSIS — Z87820 Personal history of traumatic brain injury: Secondary | ICD-10-CM | POA: Diagnosis not present

## 2024-01-12 DIAGNOSIS — Z87820 Personal history of traumatic brain injury: Secondary | ICD-10-CM | POA: Diagnosis not present

## 2024-01-13 DIAGNOSIS — Z87820 Personal history of traumatic brain injury: Secondary | ICD-10-CM | POA: Diagnosis not present

## 2024-01-14 DIAGNOSIS — Z87820 Personal history of traumatic brain injury: Secondary | ICD-10-CM | POA: Diagnosis not present

## 2024-01-15 DIAGNOSIS — Z87820 Personal history of traumatic brain injury: Secondary | ICD-10-CM | POA: Diagnosis not present

## 2024-01-16 DIAGNOSIS — Z87820 Personal history of traumatic brain injury: Secondary | ICD-10-CM | POA: Diagnosis not present

## 2024-01-17 DIAGNOSIS — Z87820 Personal history of traumatic brain injury: Secondary | ICD-10-CM | POA: Diagnosis not present

## 2024-01-18 DIAGNOSIS — Z87820 Personal history of traumatic brain injury: Secondary | ICD-10-CM | POA: Diagnosis not present

## 2024-01-19 DIAGNOSIS — Z87820 Personal history of traumatic brain injury: Secondary | ICD-10-CM | POA: Diagnosis not present

## 2024-01-20 DIAGNOSIS — Z87820 Personal history of traumatic brain injury: Secondary | ICD-10-CM | POA: Diagnosis not present

## 2024-01-21 DIAGNOSIS — Z87820 Personal history of traumatic brain injury: Secondary | ICD-10-CM | POA: Diagnosis not present

## 2024-01-22 DIAGNOSIS — Z87820 Personal history of traumatic brain injury: Secondary | ICD-10-CM | POA: Diagnosis not present

## 2024-01-23 DIAGNOSIS — Z87820 Personal history of traumatic brain injury: Secondary | ICD-10-CM | POA: Diagnosis not present

## 2024-01-24 DIAGNOSIS — Z87820 Personal history of traumatic brain injury: Secondary | ICD-10-CM | POA: Diagnosis not present

## 2024-01-25 DIAGNOSIS — Z87820 Personal history of traumatic brain injury: Secondary | ICD-10-CM | POA: Diagnosis not present

## 2024-01-26 DIAGNOSIS — Z87820 Personal history of traumatic brain injury: Secondary | ICD-10-CM | POA: Diagnosis not present

## 2024-01-27 DIAGNOSIS — Z87820 Personal history of traumatic brain injury: Secondary | ICD-10-CM | POA: Diagnosis not present

## 2024-01-28 DIAGNOSIS — Z87820 Personal history of traumatic brain injury: Secondary | ICD-10-CM | POA: Diagnosis not present

## 2024-01-29 DIAGNOSIS — Z87820 Personal history of traumatic brain injury: Secondary | ICD-10-CM | POA: Diagnosis not present

## 2024-01-30 DIAGNOSIS — Z87820 Personal history of traumatic brain injury: Secondary | ICD-10-CM | POA: Diagnosis not present

## 2024-01-31 DIAGNOSIS — Z87820 Personal history of traumatic brain injury: Secondary | ICD-10-CM | POA: Diagnosis not present

## 2024-02-01 DIAGNOSIS — Z87820 Personal history of traumatic brain injury: Secondary | ICD-10-CM | POA: Diagnosis not present

## 2024-02-02 ENCOUNTER — Other Ambulatory Visit (INDEPENDENT_AMBULATORY_CARE_PROVIDER_SITE_OTHER)

## 2024-02-02 ENCOUNTER — Ambulatory Visit: Admitting: Internal Medicine

## 2024-02-02 ENCOUNTER — Encounter: Payer: Self-pay | Admitting: Internal Medicine

## 2024-02-02 VITALS — BP 126/70 | HR 55 | Temp 98.6°F | Ht 60.0 in | Wt 125.0 lb

## 2024-02-02 DIAGNOSIS — G250 Essential tremor: Secondary | ICD-10-CM | POA: Diagnosis not present

## 2024-02-02 DIAGNOSIS — Z87820 Personal history of traumatic brain injury: Secondary | ICD-10-CM | POA: Diagnosis not present

## 2024-02-02 DIAGNOSIS — G4700 Insomnia, unspecified: Secondary | ICD-10-CM | POA: Diagnosis not present

## 2024-02-02 DIAGNOSIS — R202 Paresthesia of skin: Secondary | ICD-10-CM

## 2024-02-02 DIAGNOSIS — E559 Vitamin D deficiency, unspecified: Secondary | ICD-10-CM

## 2024-02-02 DIAGNOSIS — L659 Nonscarring hair loss, unspecified: Secondary | ICD-10-CM

## 2024-02-02 LAB — COMPREHENSIVE METABOLIC PANEL WITH GFR
ALT: 12 U/L (ref 0–35)
AST: 23 U/L (ref 0–37)
Albumin: 4 g/dL (ref 3.5–5.2)
Alkaline Phosphatase: 73 U/L (ref 39–117)
BUN: 20 mg/dL (ref 6–23)
CO2: 27 meq/L (ref 19–32)
Calcium: 8.8 mg/dL (ref 8.4–10.5)
Chloride: 106 meq/L (ref 96–112)
Creatinine, Ser: 0.78 mg/dL (ref 0.40–1.20)
GFR: 67.37 mL/min (ref >60.00)
Glucose, Bld: 94 mg/dL (ref 70–99)
Potassium: 4 meq/L (ref 3.5–5.1)
Sodium: 140 meq/L (ref 135–145)
Total Bilirubin: 0.4 mg/dL (ref 0.2–1.2)
Total Protein: 7 g/dL (ref 6.0–8.3)

## 2024-02-02 LAB — CBC WITH DIFFERENTIAL/PLATELET
Basophils Absolute: 0 K/uL (ref 0.0–0.1)
Basophils Relative: 0.7 % (ref 0.0–3.0)
Eosinophils Absolute: 0.1 K/uL (ref 0.0–0.7)
Eosinophils Relative: 1.8 % (ref 0.0–5.0)
HCT: 38.1 % (ref 36.0–46.0)
Hemoglobin: 12.1 g/dL (ref 12.0–15.0)
Lymphocytes Relative: 41.1 % (ref 12.0–46.0)
Lymphs Abs: 2.3 K/uL (ref 0.7–4.0)
MCHC: 31.6 g/dL (ref 30.0–36.0)
MCV: 74.9 fl — ABNORMAL LOW (ref 78.0–100.0)
Monocytes Absolute: 0.5 K/uL (ref 0.1–1.0)
Monocytes Relative: 8.4 % (ref 3.0–12.0)
Neutro Abs: 2.7 K/uL (ref 1.4–7.7)
Neutrophils Relative %: 48 % (ref 43.0–77.0)
Platelets: 188 K/uL (ref 150.0–400.0)
RBC: 5.09 Mil/uL (ref 3.87–5.11)
RDW: 18.4 % — ABNORMAL HIGH (ref 11.5–15.5)
WBC: 5.6 K/uL (ref 4.0–10.5)

## 2024-02-02 LAB — SEDIMENTATION RATE: Sed Rate: 4 mm/h (ref 0–30)

## 2024-02-02 MED ORDER — ROGAINE 2 % EX SOLN: Freq: Two times a day (BID) | CUTANEOUS | 5 refills | Status: DC

## 2024-02-02 MED ORDER — ROGAINE 2 % EX SOLN: Freq: Two times a day (BID) | CUTANEOUS | 5 refills | Status: AC

## 2024-02-02 MED ORDER — FINASTERIDE 5 MG PO TABS: 2.5000 mg | ORAL_TABLET | Freq: Every day | ORAL | 3 refills | Status: AC

## 2024-02-02 MED ORDER — FINASTERIDE 5 MG PO TABS: 2.5000 mg | ORAL_TABLET | Freq: Every day | ORAL | 3 refills | Status: DC

## 2024-02-02 MED ORDER — CEFUROXIME AXETIL 250 MG PO TABS: 250.0000 mg | ORAL_TABLET | Freq: Two times a day (BID) | ORAL | 0 refills | Status: AC

## 2024-02-02 NOTE — Assessment & Plan Note (Signed)
On Vit D.  Obtain vitamin D level

## 2024-02-02 NOTE — Progress Notes (Unsigned)
 Subjective:  Patient ID: Crystal Rodgers, female    DOB: 16-Feb-1933  Age: 89 y.o. 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  She relates it to being stressed about her birthday party.  Denies any unusual diet, supplements, medication change, hair product change etc.  There is no family history of alopecia.  There was no rash on the scalp, itching etc. She takes diazepam  at night, but wakes up in 2-3 hours and cannot fall asleep.  Then she takes valerian root. 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 Swallow whole.     Cholecalciferol (VITAMIN D3) 50 MCG (2000 UT) capsule Take 1 capsule (2,000 Units total) by mouth daily. 100 capsule 3   diazepam  (VALIUM ) 2 MG tablet Take 1 tablet (2 mg total) by mouth at bedtime. 30 tablet 5   famotidine  (PEPCID ) 20 MG tablet TAKE 1 TABLET BY MOUTH TWICE A DAY 180 tablet 3   fluticasone  (FLONASE ) 50 MCG/ACT nasal spray SPRAY 2 SPRAYS INTO EACH NOSTRIL EVERY DAY 48 mL 3   ketotifen  (ZADITOR ) 0.035 % ophthalmic solution Place 2 drops into both eyes in the morning and at bedtime. 10 mL 3   loratadine  (CLARITIN ) 10 MG tablet Take 1 tablet (10 mg total) by mouth daily. 30 tablet 11   meloxicam  (MOBIC ) 7.5 MG tablet Take 1 tablet (7.5 mg total) by mouth at bedtime. With food 30 tablet 5   ondansetron  (ZOFRAN ) 4 MG tablet Take 1 tablet (4 mg total) by mouth every 8 (eight) hours as needed for nausea or vomiting. 20 tablet 0   primidone  (MYSOLINE ) 50 MG tablet Take  2 tablets (100 mg total) by mouth at bedtime. 180 tablet 3   valACYclovir  (VALTREX ) 500 MG tablet Take 1 tablet (500 mg total) by mouth 3 (three) times daily. 21 tablet 1   Vitamin D , Ergocalciferol , (DRISDOL ) 1.25 MG (50000 UNIT) CAPS capsule TAKE 1 CAPSULE (50,000 UNITS TOTAL) BY MOUTH EVERY 14 DAYS 6 capsule 3   cephALEXin  (KEFLEX ) 500 MG capsule Take 1 capsule (500 mg total) by mouth 3 (three) times daily. 21 capsule 1   B Complex-Folic Acid (B COMPLEX PLUS) TABS Take 1 tablet by mouth daily. (Patient not taking: Reported on 02/02/2024) 100 tablet 3   No facility-administered medications prior to visit.    ROS: Review of Systems  Constitutional:  Negative for activity change, appetite change, chills, fatigue and unexpected weight change.  HENT:  Negative for congestion, mouth sores, sinus pressure and trouble swallowing.   Eyes:  Negative for visual disturbance.  Respiratory:  Negative for cough and chest tightness.   Cardiovascular:  Negative for leg swelling.  Gastrointestinal:  Negative for abdominal pain and nausea.  Genitourinary:  Negative for difficulty urinating, frequency and vaginal pain.  Musculoskeletal:  Positive for arthralgias and gait problem. Negative for back pain.  Skin:  Negative for pallor and rash.  Neurological:  Positive for tremors. Negative for dizziness, seizures, weakness, numbness and headaches.  Hematological:  Does not bruise/bleed easily.  Psychiatric/Behavioral:  Positive for dysphoric mood and sleep disturbance. Negative for confusion and suicidal ideas. The patient is nervous/anxious.     Objective:  BP 126/70   Pulse (!) 55   Temp 98.6 F (37 C) (Oral)   Ht 5' (1.524 m)   Wt 125 lb (56.7 kg)   LMP  (LMP Unknown)   SpO2 97%   BMI 24.41 kg/m   BP Readings from Last 3 Encounters:  02/02/24 126/70  10/12/23 116/76  05/13/23 120/70    Wt Readings from Last 3 Encounters:  02/02/24 125 lb (56.7 kg)  10/12/23 122 lb 9.6 oz (55.6 kg)   05/13/23 123 lb (55.8 kg)    Physical Exam Constitutional:      General: She is not in acute distress.    Appearance: She is well-developed.  HENT:     Head: Normocephalic.     Right Ear: External ear normal.     Left Ear: External ear normal.     Nose: Nose normal.  Eyes:     General:        Right eye: No discharge.        Left eye: No discharge.     Conjunctiva/sclera: Conjunctivae normal.     Pupils: Pupils are equal, round, and reactive to light.  Neck:     Thyroid : No thyromegaly.     Vascular: No JVD.     Trachea: No tracheal deviation.  Cardiovascular:     Rate and Rhythm: Normal rate and regular rhythm.     Heart sounds: Normal heart sounds.  Pulmonary:     Effort: No respiratory distress.     Breath sounds: No stridor. No wheezing.  Abdominal:     General: Bowel sounds are normal. There is no distension.     Palpations: Abdomen is soft. There is no mass.     Tenderness: There is no abdominal tenderness. There is no guarding or rebound.  Musculoskeletal:        General: No tenderness.     Cervical back: Normal range of motion and neck supple. No rigidity.     Right lower leg: No edema.     Left lower leg: No edema.  Lymphadenopathy:     Cervical: No cervical adenopathy.  Skin:    Findings: No erythema or rash.  Neurological:     Mental Status: Mental status is at baseline.     Cranial Nerves: No cranial nerve deficit.     Motor: No abnormal muscle tone.     Coordination: Coordination normal.     Gait: Gait abnormal.     Deep Tendon Reflexes: Reflexes normal.  Psychiatric:        Behavior: Behavior normal.        Thought Content: Thought content normal.        Judgment: Judgment normal.   Almost complete alopecia.  There are only few strands of hair left. Head tremor  Lab Results  Component Value Date   WBC 5.3 10/12/2023   HGB 12.5 10/12/2023   HCT 39.0 10/12/2023   PLT 212.0 10/12/2023   GLUCOSE 96 10/12/2023   CHOL 184 10/29/2021   TRIG  204.0 (H) 10/29/2021   HDL 56.30 10/29/2021   LDLDIRECT 115.0 10/29/2021   ALT 13 10/12/2023   AST 21 10/12/2023   NA 141 10/12/2023   K 4.8 10/12/2023   CL 106 10/12/2023   CREATININE 0.79 10/12/2023   BUN 25 (H) 10/12/2023  CO2 30 10/12/2023   TSH 2.73 10/12/2023    No results found.  Assessment & Plan:   Problem List Items Addressed This Visit     Alopecia - Primary   New hair loss in March 2025-the patient lost all her hair in 2 week time in March.  She relates it to being stressed about her birthday party.  Denies any unusual diet, supplements, medication change, hair product change etc.  There is no family history of alopecia.  There was no rash on the scalp, itching etc  The etiology remains unclear.  Differential diagnoses includes autoimmune disorder, vitamin or microalbumin deficiency, thyroid  dysfunction, etc.  Dermatology consult to consider scalp biopsy and treatment  Labs ordered including TSH, ANA, CBC, iron studies etc.  Prescribed finasteride  5 mg quarter tablets a day and Rogaine  for women on scalp daily.  Follow-up with me in a couple months      Relevant Orders   CBC with Differential/Platelet   Comprehensive metabolic panel with GFR   TSH   T4, free   T3, free   Vitamin B12   VITAMIN D  25 Hydroxy (Vit-D Deficiency, Fractures)   Urinalysis   Sedimentation rate   Iron, TIBC and Ferritin Panel (Completed)   Protein electrophoresis, serum   ANA   Zinc   Ambulatory referral to Dermatology   Essential tremor   Her tremor is unchanged      Insomnia   The patient takes diazepam  at night, but wakes up in 2-3 hours and cannot fall asleep.  Then she takes valerian root.  She wants to continue to continue with the same plan      Paresthesia   Obtain vitamin B12 level, TSH      Relevant Orders   Vitamin B12   Sedimentation rate   ANA   Zinc   Vitamin D  deficiency   On Vit D Obtain vitamin D  level      Relevant Orders   VITAMIN D  25  Hydroxy (Vit-D Deficiency, Fractures)      Meds ordered this encounter  Medications   DISCONTD: finasteride  (PROSCAR ) 5 MG tablet    Sig: Take 0.5 tablets (2.5 mg total) by mouth daily.    Dispense:  30 tablet    Refill:  3   DISCONTD: finasteride  (PROSCAR ) 5 MG tablet    Sig: Take 0.5 tablets (2.5 mg total) by mouth daily.    Dispense:  30 tablet    Refill:  3   DISCONTD: minoxidil  (ROGAINE ) 2 % external solution    Sig: Apply topically 2 (two) times daily.    Dispense:  60 mL    Refill:  5   minoxidil  (ROGAINE ) 2 % external solution    Sig: Apply topically 2 (two) times daily.    Dispense:  60 mL    Refill:  5   cefUROXime  (CEFTIN ) 250 MG tablet    Sig: Take 1 tablet (250 mg total) by mouth 2 (two) times daily with a meal.    Dispense:  14 tablet    Refill:  0   finasteride  (PROSCAR ) 5 MG tablet    Sig: Take 0.5 tablets (2.5 mg total) by mouth daily.    Dispense:  30 tablet    Refill:  3      Follow-up: Return in about 3 months (around 05/04/2024) for a follow-up visit.  Marolyn Noel, MD

## 2024-02-03 ENCOUNTER — Telehealth: Payer: Self-pay

## 2024-02-03 DIAGNOSIS — Z87820 Personal history of traumatic brain injury: Secondary | ICD-10-CM | POA: Diagnosis not present

## 2024-02-03 LAB — URINALYSIS, ROUTINE W REFLEX MICROSCOPIC
Bilirubin Urine: NEGATIVE
Ketones, ur: NEGATIVE
Leukocytes,Ua: NEGATIVE
Nitrite: POSITIVE — AB
RBC / HPF: NONE SEEN (ref 0–?)
Specific Gravity, Urine: 1.01 (ref 1.000–1.030)
Total Protein, Urine: NEGATIVE
Urine Glucose: NEGATIVE
Urobilinogen, UA: 0.2 (ref 0.0–1.0)
pH: 6 (ref 5.0–8.0)

## 2024-02-03 LAB — TSH: TSH: 1.95 u[IU]/mL (ref 0.35–5.50)

## 2024-02-03 LAB — T4, FREE: Free T4: 0.83 ng/dL (ref 0.60–1.60)

## 2024-02-03 LAB — VITAMIN D 25 HYDROXY (VIT D DEFICIENCY, FRACTURES): VITD: 61.37 ng/mL (ref 30.00–100.00)

## 2024-02-03 LAB — VITAMIN B12: Vitamin B-12: 424 pg/mL (ref 211–911)

## 2024-02-03 LAB — T3, FREE: T3, Free: 2.6 pg/mL (ref 2.3–4.2)

## 2024-02-03 NOTE — Assessment & Plan Note (Signed)
 The patient takes diazepam  at night, but wakes up in 2-3 hours and cannot fall asleep.  Then she takes valerian root.  She wants to continue to continue with the same plan

## 2024-02-03 NOTE — Telephone Encounter (Signed)
 Copied from CRM [id number]. Topic: Referral - Status >> Feb 03, 2024  2:36 PM [place] J wrote: Reason for CRM: Wanda is calling from [facility] and she is stating that their clinic does not accept the patient's insurance. She also stated that Dr. Viviane is not seeing new patients either.  Wanda was able to refer a different clinic that does accept the patient's insurance:   [facility], [facility] (IT) at [facility] [address], [place], [place] [address] [phone]

## 2024-02-03 NOTE — Assessment & Plan Note (Signed)
 Her tremor is unchanged

## 2024-02-03 NOTE — Assessment & Plan Note (Signed)
 New hair loss in March 2025-the patient lost all her hair in 2 week time in March.  She relates it to being stressed about her birthday party.  Denies any unusual diet, supplements, medication change, hair product change etc.  There is no family history of alopecia.  There was no rash on the scalp, itching etc  The etiology remains unclear.  Differential diagnoses includes autoimmune disorder, vitamin or microalbumin deficiency, thyroid  dysfunction, etc.  Dermatology consult to consider scalp biopsy and treatment  Labs ordered including TSH, ANA, CBC, iron studies etc.  Prescribed finasteride  5 mg quarter tablets a day and Rogaine  for women on scalp daily.  Follow-up with me in a couple months

## 2024-02-03 NOTE — Assessment & Plan Note (Signed)
 Obtain vitamin B12 level, TSH

## 2024-02-04 DIAGNOSIS — Z87820 Personal history of traumatic brain injury: Secondary | ICD-10-CM | POA: Diagnosis not present

## 2024-02-05 DIAGNOSIS — Z87820 Personal history of traumatic brain injury: Secondary | ICD-10-CM | POA: Diagnosis not present

## 2024-02-06 ENCOUNTER — Ambulatory Visit: Payer: Self-pay | Admitting: Internal Medicine

## 2024-02-06 DIAGNOSIS — Z87820 Personal history of traumatic brain injury: Secondary | ICD-10-CM | POA: Diagnosis not present

## 2024-02-06 DIAGNOSIS — E60 Dietary zinc deficiency: Secondary | ICD-10-CM

## 2024-02-06 DIAGNOSIS — L659 Nonscarring hair loss, unspecified: Secondary | ICD-10-CM

## 2024-02-06 DIAGNOSIS — E611 Iron deficiency: Secondary | ICD-10-CM

## 2024-02-07 DIAGNOSIS — Z87820 Personal history of traumatic brain injury: Secondary | ICD-10-CM | POA: Diagnosis not present

## 2024-02-07 LAB — ANTI-NUCLEAR AB-TITER (ANA TITER): ANA Titer 1: 1:80 {titer} — ABNORMAL HIGH

## 2024-02-07 LAB — PROTEIN ELECTROPHORESIS, SERUM
Albumin ELP: 4.1 g/dL (ref 3.8–4.8)
Alpha 1: 0.3 g/dL (ref 0.2–0.3)
Alpha 2: 0.6 g/dL (ref 0.5–0.9)
Beta 2: 0.3 g/dL (ref 0.2–0.5)
Beta Globulin: 0.5 g/dL (ref 0.4–0.6)
Gamma Globulin: 1.2 g/dL (ref 0.8–1.7)
Total Protein: 6.9 g/dL (ref 6.1–8.1)

## 2024-02-07 LAB — IRON,TIBC AND FERRITIN PANEL
%SAT: 6 % — ABNORMAL LOW (ref 16–45)
Ferritin: 4 ng/mL — ABNORMAL LOW (ref 16–288)
Iron: 22 ug/dL — ABNORMAL LOW (ref 45–160)
TIBC: 390 ug/dL (ref 250–450)

## 2024-02-07 LAB — ANA: Anti Nuclear Antibody (ANA): POSITIVE — AB

## 2024-02-07 LAB — ZINC: Zinc: 51 ug/dL — ABNORMAL LOW (ref 60–130)

## 2024-02-08 DIAGNOSIS — Z87820 Personal history of traumatic brain injury: Secondary | ICD-10-CM | POA: Diagnosis not present

## 2024-02-08 DIAGNOSIS — E611 Iron deficiency: Secondary | ICD-10-CM | POA: Insufficient documentation

## 2024-02-08 DIAGNOSIS — E60 Dietary zinc deficiency: Secondary | ICD-10-CM | POA: Insufficient documentation

## 2024-02-08 MED ORDER — IRON POLYSACCH CMPLX-B12-FA 150-0.025-1 MG PO CAPS
1.0000 | ORAL_CAPSULE | Freq: Every day | ORAL | 5 refills | Status: AC
Start: 2024-02-08 — End: ?

## 2024-02-08 MED ORDER — ZINC 50 MG PO TABS
ORAL_TABLET | ORAL | 5 refills | Status: AC
Start: 2024-02-08 — End: ?

## 2024-02-09 DIAGNOSIS — Z87820 Personal history of traumatic brain injury: Secondary | ICD-10-CM | POA: Diagnosis not present

## 2024-02-10 ENCOUNTER — Other Ambulatory Visit: Payer: Self-pay

## 2024-02-10 DIAGNOSIS — Z87820 Personal history of traumatic brain injury: Secondary | ICD-10-CM | POA: Diagnosis not present

## 2024-02-10 DIAGNOSIS — G243 Spasmodic torticollis: Secondary | ICD-10-CM

## 2024-02-10 DIAGNOSIS — G8929 Other chronic pain: Secondary | ICD-10-CM

## 2024-02-10 DIAGNOSIS — M79662 Pain in left lower leg: Secondary | ICD-10-CM

## 2024-02-10 NOTE — Progress Notes (Signed)
 Pt son has stated pt is needing a 4 wheeled seated walker to help with mobility.

## 2024-02-11 DIAGNOSIS — Z87820 Personal history of traumatic brain injury: Secondary | ICD-10-CM | POA: Diagnosis not present

## 2024-02-12 DIAGNOSIS — Z87820 Personal history of traumatic brain injury: Secondary | ICD-10-CM | POA: Diagnosis not present

## 2024-02-13 DIAGNOSIS — Z87820 Personal history of traumatic brain injury: Secondary | ICD-10-CM | POA: Diagnosis not present

## 2024-02-14 DIAGNOSIS — Z87820 Personal history of traumatic brain injury: Secondary | ICD-10-CM | POA: Diagnosis not present

## 2024-02-15 DIAGNOSIS — Z87820 Personal history of traumatic brain injury: Secondary | ICD-10-CM | POA: Diagnosis not present

## 2024-02-16 DIAGNOSIS — Z87820 Personal history of traumatic brain injury: Secondary | ICD-10-CM | POA: Diagnosis not present

## 2024-02-17 DIAGNOSIS — Z87820 Personal history of traumatic brain injury: Secondary | ICD-10-CM | POA: Diagnosis not present

## 2024-02-18 DIAGNOSIS — Z87820 Personal history of traumatic brain injury: Secondary | ICD-10-CM | POA: Diagnosis not present

## 2024-02-19 DIAGNOSIS — Z87820 Personal history of traumatic brain injury: Secondary | ICD-10-CM | POA: Diagnosis not present

## 2024-02-20 DIAGNOSIS — Z87820 Personal history of traumatic brain injury: Secondary | ICD-10-CM | POA: Diagnosis not present

## 2024-02-21 DIAGNOSIS — Z87820 Personal history of traumatic brain injury: Secondary | ICD-10-CM | POA: Diagnosis not present

## 2024-02-22 DIAGNOSIS — Z87820 Personal history of traumatic brain injury: Secondary | ICD-10-CM | POA: Diagnosis not present

## 2024-02-23 DIAGNOSIS — Z87820 Personal history of traumatic brain injury: Secondary | ICD-10-CM | POA: Diagnosis not present

## 2024-02-24 DIAGNOSIS — Z87820 Personal history of traumatic brain injury: Secondary | ICD-10-CM | POA: Diagnosis not present

## 2024-02-25 DIAGNOSIS — Z87820 Personal history of traumatic brain injury: Secondary | ICD-10-CM | POA: Diagnosis not present

## 2024-02-26 DIAGNOSIS — Z87820 Personal history of traumatic brain injury: Secondary | ICD-10-CM | POA: Diagnosis not present

## 2024-02-27 DIAGNOSIS — Z87820 Personal history of traumatic brain injury: Secondary | ICD-10-CM | POA: Diagnosis not present

## 2024-02-28 DIAGNOSIS — Z87820 Personal history of traumatic brain injury: Secondary | ICD-10-CM | POA: Diagnosis not present

## 2024-02-29 DIAGNOSIS — Z87820 Personal history of traumatic brain injury: Secondary | ICD-10-CM | POA: Diagnosis not present

## 2024-03-01 DIAGNOSIS — Z87820 Personal history of traumatic brain injury: Secondary | ICD-10-CM | POA: Diagnosis not present

## 2024-03-02 DIAGNOSIS — Z87820 Personal history of traumatic brain injury: Secondary | ICD-10-CM | POA: Diagnosis not present

## 2024-03-03 DIAGNOSIS — Z87820 Personal history of traumatic brain injury: Secondary | ICD-10-CM | POA: Diagnosis not present

## 2024-03-04 DIAGNOSIS — Z87820 Personal history of traumatic brain injury: Secondary | ICD-10-CM | POA: Diagnosis not present

## 2024-03-05 DIAGNOSIS — Z87820 Personal history of traumatic brain injury: Secondary | ICD-10-CM | POA: Diagnosis not present

## 2024-03-06 DIAGNOSIS — Z87820 Personal history of traumatic brain injury: Secondary | ICD-10-CM | POA: Diagnosis not present

## 2024-03-07 DIAGNOSIS — Z87820 Personal history of traumatic brain injury: Secondary | ICD-10-CM | POA: Diagnosis not present

## 2024-03-08 DIAGNOSIS — Z87820 Personal history of traumatic brain injury: Secondary | ICD-10-CM | POA: Diagnosis not present

## 2024-03-09 DIAGNOSIS — Z87820 Personal history of traumatic brain injury: Secondary | ICD-10-CM | POA: Diagnosis not present

## 2024-03-10 DIAGNOSIS — Z87820 Personal history of traumatic brain injury: Secondary | ICD-10-CM | POA: Diagnosis not present

## 2024-03-11 DIAGNOSIS — Z87820 Personal history of traumatic brain injury: Secondary | ICD-10-CM | POA: Diagnosis not present

## 2024-03-12 DIAGNOSIS — Z87820 Personal history of traumatic brain injury: Secondary | ICD-10-CM | POA: Diagnosis not present

## 2024-03-13 DIAGNOSIS — Z87820 Personal history of traumatic brain injury: Secondary | ICD-10-CM | POA: Diagnosis not present

## 2024-03-14 DIAGNOSIS — Z87820 Personal history of traumatic brain injury: Secondary | ICD-10-CM | POA: Diagnosis not present

## 2024-03-15 DIAGNOSIS — Z87820 Personal history of traumatic brain injury: Secondary | ICD-10-CM | POA: Diagnosis not present

## 2024-03-16 DIAGNOSIS — Z87820 Personal history of traumatic brain injury: Secondary | ICD-10-CM | POA: Diagnosis not present

## 2024-03-17 DIAGNOSIS — Z87820 Personal history of traumatic brain injury: Secondary | ICD-10-CM | POA: Diagnosis not present

## 2024-03-18 DIAGNOSIS — Z87820 Personal history of traumatic brain injury: Secondary | ICD-10-CM | POA: Diagnosis not present

## 2024-03-19 DIAGNOSIS — Z87820 Personal history of traumatic brain injury: Secondary | ICD-10-CM | POA: Diagnosis not present

## 2024-03-20 DIAGNOSIS — Z87820 Personal history of traumatic brain injury: Secondary | ICD-10-CM | POA: Diagnosis not present

## 2024-03-21 DIAGNOSIS — Z87820 Personal history of traumatic brain injury: Secondary | ICD-10-CM | POA: Diagnosis not present

## 2024-03-22 DIAGNOSIS — Z87820 Personal history of traumatic brain injury: Secondary | ICD-10-CM | POA: Diagnosis not present

## 2024-03-23 DIAGNOSIS — Z87820 Personal history of traumatic brain injury: Secondary | ICD-10-CM | POA: Diagnosis not present

## 2024-03-24 DIAGNOSIS — Z87820 Personal history of traumatic brain injury: Secondary | ICD-10-CM | POA: Diagnosis not present

## 2024-03-25 DIAGNOSIS — Z87820 Personal history of traumatic brain injury: Secondary | ICD-10-CM | POA: Diagnosis not present

## 2024-03-26 DIAGNOSIS — Z87820 Personal history of traumatic brain injury: Secondary | ICD-10-CM | POA: Diagnosis not present

## 2024-03-27 DIAGNOSIS — Z87820 Personal history of traumatic brain injury: Secondary | ICD-10-CM | POA: Diagnosis not present

## 2024-03-28 DIAGNOSIS — Z87820 Personal history of traumatic brain injury: Secondary | ICD-10-CM | POA: Diagnosis not present

## 2024-03-29 DIAGNOSIS — Z87820 Personal history of traumatic brain injury: Secondary | ICD-10-CM | POA: Diagnosis not present

## 2024-03-30 DIAGNOSIS — Z87820 Personal history of traumatic brain injury: Secondary | ICD-10-CM | POA: Diagnosis not present

## 2024-03-31 DIAGNOSIS — Z87820 Personal history of traumatic brain injury: Secondary | ICD-10-CM | POA: Diagnosis not present

## 2024-04-01 DIAGNOSIS — Z87820 Personal history of traumatic brain injury: Secondary | ICD-10-CM | POA: Diagnosis not present

## 2024-04-02 DIAGNOSIS — Z87820 Personal history of traumatic brain injury: Secondary | ICD-10-CM | POA: Diagnosis not present

## 2024-04-03 DIAGNOSIS — Z87820 Personal history of traumatic brain injury: Secondary | ICD-10-CM | POA: Diagnosis not present

## 2024-04-04 DIAGNOSIS — Z87820 Personal history of traumatic brain injury: Secondary | ICD-10-CM | POA: Diagnosis not present

## 2024-04-06 DIAGNOSIS — Z87820 Personal history of traumatic brain injury: Secondary | ICD-10-CM | POA: Diagnosis not present

## 2024-04-08 DIAGNOSIS — Z87820 Personal history of traumatic brain injury: Secondary | ICD-10-CM | POA: Diagnosis not present

## 2024-04-09 DIAGNOSIS — Z87820 Personal history of traumatic brain injury: Secondary | ICD-10-CM | POA: Diagnosis not present

## 2024-04-10 DIAGNOSIS — Z87820 Personal history of traumatic brain injury: Secondary | ICD-10-CM | POA: Diagnosis not present

## 2024-04-11 DIAGNOSIS — Z87820 Personal history of traumatic brain injury: Secondary | ICD-10-CM | POA: Diagnosis not present

## 2024-04-12 DIAGNOSIS — Z87820 Personal history of traumatic brain injury: Secondary | ICD-10-CM | POA: Diagnosis not present

## 2024-04-13 DIAGNOSIS — Z87820 Personal history of traumatic brain injury: Secondary | ICD-10-CM | POA: Diagnosis not present

## 2024-04-14 DIAGNOSIS — Z87820 Personal history of traumatic brain injury: Secondary | ICD-10-CM | POA: Diagnosis not present

## 2024-04-15 DIAGNOSIS — Z87820 Personal history of traumatic brain injury: Secondary | ICD-10-CM | POA: Diagnosis not present

## 2024-04-16 DIAGNOSIS — Z87820 Personal history of traumatic brain injury: Secondary | ICD-10-CM | POA: Diagnosis not present

## 2024-04-17 DIAGNOSIS — Z87820 Personal history of traumatic brain injury: Secondary | ICD-10-CM | POA: Diagnosis not present

## 2024-04-18 DIAGNOSIS — Z87820 Personal history of traumatic brain injury: Secondary | ICD-10-CM | POA: Diagnosis not present

## 2024-04-19 DIAGNOSIS — Z87820 Personal history of traumatic brain injury: Secondary | ICD-10-CM | POA: Diagnosis not present

## 2024-04-20 DIAGNOSIS — Z87820 Personal history of traumatic brain injury: Secondary | ICD-10-CM | POA: Diagnosis not present

## 2024-04-21 DIAGNOSIS — Z87820 Personal history of traumatic brain injury: Secondary | ICD-10-CM | POA: Diagnosis not present

## 2024-04-22 DIAGNOSIS — Z87820 Personal history of traumatic brain injury: Secondary | ICD-10-CM | POA: Diagnosis not present

## 2024-04-23 DIAGNOSIS — Z87820 Personal history of traumatic brain injury: Secondary | ICD-10-CM | POA: Diagnosis not present

## 2024-04-24 DIAGNOSIS — Z87820 Personal history of traumatic brain injury: Secondary | ICD-10-CM | POA: Diagnosis not present

## 2024-04-25 DIAGNOSIS — Z87820 Personal history of traumatic brain injury: Secondary | ICD-10-CM | POA: Diagnosis not present

## 2024-04-26 DIAGNOSIS — Z87820 Personal history of traumatic brain injury: Secondary | ICD-10-CM | POA: Diagnosis not present

## 2024-04-27 DIAGNOSIS — Z87820 Personal history of traumatic brain injury: Secondary | ICD-10-CM | POA: Diagnosis not present

## 2024-04-28 DIAGNOSIS — Z87820 Personal history of traumatic brain injury: Secondary | ICD-10-CM | POA: Diagnosis not present

## 2024-04-29 DIAGNOSIS — Z87820 Personal history of traumatic brain injury: Secondary | ICD-10-CM | POA: Diagnosis not present

## 2024-05-01 DIAGNOSIS — Z87820 Personal history of traumatic brain injury: Secondary | ICD-10-CM | POA: Diagnosis not present

## 2024-05-03 DIAGNOSIS — Z87820 Personal history of traumatic brain injury: Secondary | ICD-10-CM | POA: Diagnosis not present

## 2024-05-04 DIAGNOSIS — Z87820 Personal history of traumatic brain injury: Secondary | ICD-10-CM | POA: Diagnosis not present

## 2024-05-05 DIAGNOSIS — Z87820 Personal history of traumatic brain injury: Secondary | ICD-10-CM | POA: Diagnosis not present

## 2024-05-06 DIAGNOSIS — Z87820 Personal history of traumatic brain injury: Secondary | ICD-10-CM | POA: Diagnosis not present

## 2024-05-07 DIAGNOSIS — Z87820 Personal history of traumatic brain injury: Secondary | ICD-10-CM | POA: Diagnosis not present

## 2024-05-08 ENCOUNTER — Encounter: Payer: Self-pay | Admitting: Internal Medicine

## 2024-05-08 DIAGNOSIS — Z87820 Personal history of traumatic brain injury: Secondary | ICD-10-CM | POA: Diagnosis not present

## 2024-05-09 DIAGNOSIS — Z87820 Personal history of traumatic brain injury: Secondary | ICD-10-CM | POA: Diagnosis not present

## 2024-05-10 DIAGNOSIS — Z87820 Personal history of traumatic brain injury: Secondary | ICD-10-CM | POA: Diagnosis not present

## 2024-05-11 DIAGNOSIS — Z87820 Personal history of traumatic brain injury: Secondary | ICD-10-CM | POA: Diagnosis not present

## 2024-05-12 ENCOUNTER — Encounter: Payer: Self-pay | Admitting: Gastroenterology

## 2024-05-13 DIAGNOSIS — Z87820 Personal history of traumatic brain injury: Secondary | ICD-10-CM | POA: Diagnosis not present

## 2024-05-14 DIAGNOSIS — Z87820 Personal history of traumatic brain injury: Secondary | ICD-10-CM | POA: Diagnosis not present

## 2024-05-15 DIAGNOSIS — Z87820 Personal history of traumatic brain injury: Secondary | ICD-10-CM | POA: Diagnosis not present

## 2024-05-16 DIAGNOSIS — Z87820 Personal history of traumatic brain injury: Secondary | ICD-10-CM | POA: Diagnosis not present

## 2024-05-18 DIAGNOSIS — Z87820 Personal history of traumatic brain injury: Secondary | ICD-10-CM | POA: Diagnosis not present

## 2024-05-19 DIAGNOSIS — Z87820 Personal history of traumatic brain injury: Secondary | ICD-10-CM | POA: Diagnosis not present

## 2024-05-20 DIAGNOSIS — Z87820 Personal history of traumatic brain injury: Secondary | ICD-10-CM | POA: Diagnosis not present

## 2024-05-21 DIAGNOSIS — Z87820 Personal history of traumatic brain injury: Secondary | ICD-10-CM | POA: Diagnosis not present

## 2024-05-22 DIAGNOSIS — Z87820 Personal history of traumatic brain injury: Secondary | ICD-10-CM | POA: Diagnosis not present

## 2024-05-23 DIAGNOSIS — Z87820 Personal history of traumatic brain injury: Secondary | ICD-10-CM | POA: Diagnosis not present

## 2024-05-25 DIAGNOSIS — Z87820 Personal history of traumatic brain injury: Secondary | ICD-10-CM | POA: Diagnosis not present

## 2024-05-27 DIAGNOSIS — Z87820 Personal history of traumatic brain injury: Secondary | ICD-10-CM | POA: Diagnosis not present

## 2024-05-29 DIAGNOSIS — Z87820 Personal history of traumatic brain injury: Secondary | ICD-10-CM | POA: Diagnosis not present

## 2024-05-30 DIAGNOSIS — Z87820 Personal history of traumatic brain injury: Secondary | ICD-10-CM | POA: Diagnosis not present

## 2024-05-31 DIAGNOSIS — Z87820 Personal history of traumatic brain injury: Secondary | ICD-10-CM | POA: Diagnosis not present

## 2024-06-02 DIAGNOSIS — Z87820 Personal history of traumatic brain injury: Secondary | ICD-10-CM | POA: Diagnosis not present

## 2024-06-03 DIAGNOSIS — Z87820 Personal history of traumatic brain injury: Secondary | ICD-10-CM | POA: Diagnosis not present

## 2024-06-04 DIAGNOSIS — Z87820 Personal history of traumatic brain injury: Secondary | ICD-10-CM | POA: Diagnosis not present

## 2024-06-05 DIAGNOSIS — Z87820 Personal history of traumatic brain injury: Secondary | ICD-10-CM | POA: Diagnosis not present

## 2024-06-08 DIAGNOSIS — Z87820 Personal history of traumatic brain injury: Secondary | ICD-10-CM | POA: Diagnosis not present

## 2024-06-09 DIAGNOSIS — Z87820 Personal history of traumatic brain injury: Secondary | ICD-10-CM | POA: Diagnosis not present

## 2024-06-10 DIAGNOSIS — Z87820 Personal history of traumatic brain injury: Secondary | ICD-10-CM | POA: Diagnosis not present

## 2024-06-11 DIAGNOSIS — Z87820 Personal history of traumatic brain injury: Secondary | ICD-10-CM | POA: Diagnosis not present

## 2024-06-12 DIAGNOSIS — Z87820 Personal history of traumatic brain injury: Secondary | ICD-10-CM | POA: Diagnosis not present

## 2024-06-13 DIAGNOSIS — Z87820 Personal history of traumatic brain injury: Secondary | ICD-10-CM | POA: Diagnosis not present

## 2024-06-15 DIAGNOSIS — Z87820 Personal history of traumatic brain injury: Secondary | ICD-10-CM | POA: Diagnosis not present

## 2024-06-16 DIAGNOSIS — Z87820 Personal history of traumatic brain injury: Secondary | ICD-10-CM | POA: Diagnosis not present

## 2024-06-19 DIAGNOSIS — Z87820 Personal history of traumatic brain injury: Secondary | ICD-10-CM | POA: Diagnosis not present

## 2024-06-20 DIAGNOSIS — Z87820 Personal history of traumatic brain injury: Secondary | ICD-10-CM | POA: Diagnosis not present

## 2024-06-21 DIAGNOSIS — Z87820 Personal history of traumatic brain injury: Secondary | ICD-10-CM | POA: Diagnosis not present

## 2024-06-22 DIAGNOSIS — Z87820 Personal history of traumatic brain injury: Secondary | ICD-10-CM | POA: Diagnosis not present

## 2024-06-23 ENCOUNTER — Other Ambulatory Visit: Payer: Self-pay | Admitting: Internal Medicine

## 2024-06-23 DIAGNOSIS — Z87820 Personal history of traumatic brain injury: Secondary | ICD-10-CM | POA: Diagnosis not present

## 2024-06-24 DIAGNOSIS — Z87820 Personal history of traumatic brain injury: Secondary | ICD-10-CM | POA: Diagnosis not present

## 2024-06-30 ENCOUNTER — Ambulatory Visit: Admitting: Gastroenterology

## 2024-06-30 ENCOUNTER — Other Ambulatory Visit

## 2024-06-30 VITALS — BP 142/80 | HR 88 | Ht <= 58 in | Wt 131.4 lb

## 2024-06-30 DIAGNOSIS — E611 Iron deficiency: Secondary | ICD-10-CM | POA: Diagnosis not present

## 2024-06-30 DIAGNOSIS — K5900 Constipation, unspecified: Secondary | ICD-10-CM

## 2024-06-30 DIAGNOSIS — K5909 Other constipation: Secondary | ICD-10-CM | POA: Diagnosis not present

## 2024-06-30 LAB — IBC + FERRITIN
Ferritin: 4.6 ng/mL — ABNORMAL LOW (ref 10.0–291.0)
Iron: 19 ug/dL — ABNORMAL LOW (ref 42–145)
Saturation Ratios: 4.1 % — ABNORMAL LOW (ref 20.0–50.0)
TIBC: 467.6 ug/dL — ABNORMAL HIGH (ref 250.0–450.0)
Transferrin: 334 mg/dL (ref 212.0–360.0)

## 2024-06-30 LAB — CBC WITH DIFFERENTIAL/PLATELET
Basophils Absolute: 0 K/uL (ref 0.0–0.1)
Basophils Relative: 0.5 % (ref 0.0–3.0)
Eosinophils Absolute: 0.1 K/uL (ref 0.0–0.7)
Eosinophils Relative: 2.2 % (ref 0.0–5.0)
HCT: 35.6 % — ABNORMAL LOW (ref 36.0–46.0)
Hemoglobin: 11.4 g/dL — ABNORMAL LOW (ref 12.0–15.0)
Lymphocytes Relative: 36.5 % (ref 12.0–46.0)
Lymphs Abs: 2.3 K/uL (ref 0.7–4.0)
MCHC: 32.1 g/dL (ref 30.0–36.0)
MCV: 71.8 fl — ABNORMAL LOW (ref 78.0–100.0)
Monocytes Absolute: 0.5 K/uL (ref 0.1–1.0)
Monocytes Relative: 7.9 % (ref 3.0–12.0)
Neutro Abs: 3.3 K/uL (ref 1.4–7.7)
Neutrophils Relative %: 52.9 % (ref 43.0–77.0)
Platelets: 194 K/uL (ref 150.0–400.0)
RBC: 4.96 Mil/uL (ref 3.87–5.11)
RDW: 18.6 % — ABNORMAL HIGH (ref 11.5–15.5)
WBC: 6.3 K/uL (ref 4.0–10.5)

## 2024-06-30 LAB — BASIC METABOLIC PANEL WITH GFR
BUN: 25 mg/dL — ABNORMAL HIGH (ref 6–23)
CO2: 29 meq/L (ref 19–32)
Calcium: 9.1 mg/dL (ref 8.4–10.5)
Chloride: 107 meq/L (ref 96–112)
Creatinine, Ser: 0.7 mg/dL (ref 0.40–1.20)
GFR: 76.5 mL/min
Glucose, Bld: 99 mg/dL (ref 70–99)
Potassium: 4.3 meq/L (ref 3.5–5.1)
Sodium: 142 meq/L (ref 135–145)

## 2024-06-30 NOTE — Progress Notes (Signed)
 "  Crystal Rodgers [id number] 03/30/1933   Chief Complaint: Iron  deficiency  Referring Provider: Garald Karlynn GAILS, MD Primary GI MD: Sampson  HPI: Crystal Rodgers is a 89 y.o. female with past medical history of alopecia, vitamin D  deficiency, essential tremor, insomnia, iron  deficiency, zinc  deficiency who presents today for evaluation of iron  deficiency.    Referred by Dr. Garald to Dr. Nandigam for evaluation of potential GI issues that could lead to iron  and zinc  deficiency.  Referral was placed in July.  Seen by PCP 02/02/2024 for management of chronic issues including alopecia, insomnia, paresthesias, vitamin D  deficiency.  Labs showed low iron  without anemia as well as low zinc , and was advised to see GI for further evaluation.  Labs 02/02/2024: Hemoglobin 12.1, MCV 74.9, platelets 188, iron  22, ferritin 4, saturation 6%, normal CMP, normal TSH, normal B12, normal vitamin D , normal sed rate, positive ANA, Zinc  51    Discussed the use of AI scribe software for clinical note transcription with the patient, who gave verbal consent to proceed.  History of Present Illness Crystal Rodgers is an 89 year old female with iron  deficiency and chronic constipation who presents for evaluation of iron  deficiency. Patient speaks Russian, and is accompanied by her son who interprets for patient.  Interpretation services were offered and they declined.  Iron  deficiency and associated symptoms - Iron  deficiency identified on laboratory testing. - Hemoglobin measured at 12.1 g/dL four months ago. - Iron  and zinc  supplementation initiated since diagnosis. - No recent unintentional weight loss; weight has increased over the past three years since immigration from [place]. - Appetite remains good with regular dietary intake, including salad with every meal. - Acute onset of hair loss over one week. - No dermatology evaluation to date due to appointment availability.  Chronic  constipation - Bowel movements occur every two to three days with straining. - Previously used senna or Miralax as needed, which was effective. - Currently not taking medication for constipation. - Stools are brown without melena or hematochezia.  Gastrointestinal and surgical history - History of possible EGD approximately fifteen years ago in [place], reportedly normal. - No history of colonoscopy. - Multiple prior abdominal surgeries, including cholecystectomy, appendectomy, polyp removal, bilateral hernia repairs, and surgical correction of vaginal prolapse.  Medication use - Occasional use of low-dose aspirin.   Previous GI Procedures/Imaging    Current Outpatient Medications  Medication Sig Dispense Refill   AMBULATORY NON FORMULARY MEDICATION Walker; 2 wheels, 2 tennis balls Dx: gait instability R26.81 1 Device 0   aspirin EC 81 MG tablet Take 81 mg by mouth daily. Swallow whole.     B Complex-Folic Acid (B COMPLEX PLUS) TABS Take 1 tablet by mouth daily. 100 tablet 3   cefUROXime  (CEFTIN ) 250 MG tablet Take 1 tablet (250 mg total) by mouth 2 (two) times daily with a meal. 14 tablet 0   Cholecalciferol (VITAMIN D3) 50 MCG (2000 UT) capsule Take 1 capsule (2,000 Units total) by mouth daily. 100 capsule 3   diazepam  (VALIUM ) 2 MG tablet Take 1 tablet (2 mg total) by mouth at bedtime. 30 tablet 5   famotidine  (PEPCID ) 20 MG tablet TAKE 1 TABLET BY MOUTH TWICE A DAY 180 tablet 3   finasteride  (PROSCAR ) 5 MG tablet Take 0.5 tablets (2.5 mg total) by mouth daily. 30 tablet 3   fluticasone  (FLONASE ) 50 MCG/ACT nasal spray SPRAY 2 SPRAYS INTO EACH NOSTRIL EVERY DAY 48 mL 3   Iron  Polysacch Cmplx-B12-FA 150-0.025-1 MG CAPS Take 1  capsule by mouth daily. 30 capsule 5   ketotifen  (ZADITOR ) 0.035 % ophthalmic solution Place 2 drops into both eyes in the morning and at bedtime. 10 mL 3   loratadine  (CLARITIN ) 10 MG tablet Take 1 tablet (10 mg total) by mouth daily. 30 tablet 11    meloxicam  (MOBIC ) 7.5 MG tablet TAKE 1 TABLET (7.5 MG TOTAL) BY MOUTH AT BEDTIME. WITH FOOD 30 tablet 5   minoxidil  (ROGAINE ) 2 % external solution Apply topically 2 (two) times daily. 60 mL 5   ondansetron  (ZOFRAN ) 4 MG tablet Take 1 tablet (4 mg total) by mouth every 8 (eight) hours as needed for nausea or vomiting. 20 tablet 0   primidone  (MYSOLINE ) 50 MG tablet Take 2 tablets (100 mg total) by mouth at bedtime. 180 tablet 3   valACYclovir  (VALTREX ) 500 MG tablet Take 1 tablet (500 mg total) by mouth 3 (three) times daily. 21 tablet 1   Vitamin D , Ergocalciferol , (DRISDOL ) 1.25 MG (50000 UNIT) CAPS capsule TAKE 1 CAPSULE (50,000 UNITS TOTAL) BY MOUTH EVERY 14 DAYS 6 capsule 3   Zinc  50 MG TABS Take 1 tablet daily by mouth 30 tablet 5   No current facility-administered medications for this visit.    Allergies as of 06/30/2024   (No Known Allergies)    History reviewed. No pertinent family history.  Social History[1]   Review of Systems:    Constitutional: No weight loss, fever, chills Cardiovascular: No chest pain Respiratory: No SOB  Gastrointestinal: See HPI and otherwise negative   Physical Exam:  Vital signs: BP (!) 142/80   Pulse 88   Ht 4' 8 (1.422 m)   Wt 131 lb 6 oz (59.6 kg)   LMP  (LMP Unknown)   BMI 29.45 kg/m   Wt Readings from Last 3 Encounters:  06/30/24 131 lb 6 oz (59.6 kg)  02/02/24 125 lb (56.7 kg)  10/12/23 122 lb 9.6 oz (55.6 kg)   Constitutional: Pleasant, well-appearing female in NAD, alert and cooperative, tremor noted Head:  Normocephalic and atraumatic.  Respiratory: Respirations even and unlabored. Lungs clear to auscultation bilaterally.  No wheezes, crackles, or rhonchi.  Cardiovascular:  Regular rate and rhythm. No murmurs. No peripheral edema. Gastrointestinal:  Soft, nondistended, nontender. No rebound or guarding. Normal bowel sounds. No appreciable masses or hepatomegaly. Rectal:  Not performed.  Neurologic:  Alert and oriented x4;   grossly normal neurologically.  Skin:   Dry and intact without significant lesions or rashes. Psychiatric: Oriented to person, place and time. Demonstrates good judgement and reason without abnormal affect or behaviors.   RELEVANT LABS AND IMAGING: CBC    Component Value Date/Time   WBC 5.6 02/02/2024 1653   RBC 5.09 02/02/2024 1653   HGB 12.1 02/02/2024 1653   HCT 38.1 02/02/2024 1653   PLT 188.0 02/02/2024 1653   MCV 74.9 (L) 02/02/2024 1653   MCH 24.4 (L) 04/20/2023 1724   MCHC 31.6 02/02/2024 1653   RDW 18.4 (H) 02/02/2024 1653   LYMPHSABS 2.3 02/02/2024 1653   MONOABS 0.5 02/02/2024 1653   EOSABS 0.1 02/02/2024 1653   BASOSABS 0.0 02/02/2024 1653    CMP     Component Value Date/Time   NA 140 02/02/2024 1653   K 4.0 02/02/2024 1653   CL 106 02/02/2024 1653   CO2 27 02/02/2024 1653   GLUCOSE 94 02/02/2024 1653   BUN 20 02/02/2024 1653   CREATININE 0.78 02/02/2024 1653   CALCIUM 8.8 02/02/2024 1653   PROT 6.9 02/02/2024 1653  PROT 7.0 02/02/2024 1653   ALBUMIN 4.0 02/02/2024 1653   AST 23 02/02/2024 1653   ALT 12 02/02/2024 1653   ALKPHOS 73 02/02/2024 1653   BILITOT 0.4 02/02/2024 1653   GFRNONAA >60 04/20/2023 1724     Assessment/Plan:   Assessment & Plan Iron  deficiency  Chronic constipation Patient seen today for further evaluation of iron  deficiency.  Labs done by PCP in July with hemoglobin 12.1, low MCV 74.9, increased RDW 18.4, iron  22, ferritin 4, saturation 6%. She has started an iron  supplement. Patient denies any evidence of overt GI bleeding including rectal bleeding, melena, hematemesis or coffee-ground emesis.  Denies reflux or heartburn.  Denies dysphagia. She does report chronic constipation for which she has previously used stool softeners and MiraLAX as needed but is not currently on any bowel regimen.  Advised to maintain a high-fiber diet.  Has a bowel movement every 2 to 3 days, with straining. Denies prior colonoscopy. Reports possibly  having an upper endoscopy 15 years ago in [place] (for acid reflux?) which was normal.  We discussed further workup for iron  deficiency, and concern that this could progress to anemia and may be a sign of ongoing GI losses.  Discussed the possibility of underlying GI malignancy as well.  I offered endoscopic evaluation (EGD/colonoscopy) which patient and son declined due to concerns regarding cognitive effects of anesthesia. I also offered follow-up office visit with MD to discuss further, which they declined. They are agreeable to repeating labs today and determining next steps based on results.  They will discuss further with patient's PCP. I will plan to discuss further with Dr. Nandigam once results come in.  - Labs today: CBC, BMP, iron /ferritin - Defer endoscopic evaluation at this time per patient preference - Consider taking half capful of MiraLAX daily to avoid straining with bowel movements and improve regularity. - Continue oral iron  supplement.   Camie Furbish, PA-C [facility] 06/30/2024, 1:33 PM  Patient Care Team: Garald Karlynn GAILS, MD as PCP - General (Internal Medicine)       [1]  Social History Tobacco Use   Smoking status: Never  Substance Use Topics   Drug use: Never   "

## 2024-06-30 NOTE — Patient Instructions (Addendum)
 Your provider has requested that you go to the basement level for lab work before leaving today. Press B on the elevator. The lab is located at the first door on the left as you exit the elevator.   _______________________________________________________  If your blood pressure at your visit was 140/90 or greater, please contact your primary care physician to follow up on this.  _______________________________________________________  If you are age 88 or older, your body mass index should be between 23-30. Your Body mass index is 29.45 kg/m. If this is out of the aforementioned range listed, please consider follow up with your Primary Care Provider.  If you are age 38 or younger, your body mass index should be between 19-25. Your Body mass index is 29.45 kg/m. If this is out of the aformentioned range listed, please consider follow up with your Primary Care Provider.   ________________________________________________________  The [facility] providers would like to encourage you to use MYCHART to communicate with providers for non-urgent requests or questions.  Due to long hold times on the telephone, sending your provider a message by [facility] may be a faster and more efficient way to get a response.  Please allow 48 business hours for a response.  Please remember that this is for non-urgent requests.  _______________________________________________________  [facility] is using a team-based approach to care.  Your team is made up of your doctor and two to three APPS. Our APPS (Nurse Practitioners and Physician Assistants) work with your physician to ensure care continuity for you. They are fully qualified to address your health concerns and develop a treatment plan. They communicate directly with your gastroenterologist to care for you. Seeing the Advanced Practice Practitioners on your physician's team can help you by facilitating care more promptly, often allowing for earlier  appointments, access to diagnostic testing, procedures, and other specialty referrals.

## 2024-07-03 ENCOUNTER — Ambulatory Visit: Payer: Self-pay | Admitting: Gastroenterology

## 2024-07-10 ENCOUNTER — Encounter: Payer: Self-pay | Admitting: Internal Medicine

## 2024-07-12 NOTE — Telephone Encounter (Signed)
 Pts son has stated  ? ???????????, ???????!  She does not have coughing anymore, no fever now. Im afraid to move her anywhere now.  Lets plan for the next week, Ill call and schedule her to see you.    Thank you  Ariel  [phone]

## 2024-08-18 ENCOUNTER — Other Ambulatory Visit: Payer: Self-pay | Admitting: Neurology

## 2024-08-18 ENCOUNTER — Other Ambulatory Visit: Payer: Self-pay | Admitting: Internal Medicine

## 2024-08-18 DIAGNOSIS — G250 Essential tremor: Secondary | ICD-10-CM

## 2024-08-18 DIAGNOSIS — G243 Spasmodic torticollis: Secondary | ICD-10-CM
